# Patient Record
Sex: Female | Born: 1945 | Race: Black or African American | Hispanic: No | Smoking: Former smoker
Health system: Southern US, Community
[De-identification: ages and names within clinical notes are randomized; demographics above are authoritative.]

## PROBLEM LIST (undated history)

## (undated) DIAGNOSIS — IMO0001 Reserved for inherently not codable concepts without codable children: Secondary | ICD-10-CM

## (undated) DIAGNOSIS — Z9221 Personal history of antineoplastic chemotherapy: Secondary | ICD-10-CM

## (undated) DIAGNOSIS — C9210 Chronic myeloid leukemia, BCR/ABL-positive, not having achieved remission: Secondary | ICD-10-CM

## (undated) DIAGNOSIS — C569 Malignant neoplasm of unspecified ovary: Secondary | ICD-10-CM

## (undated) DIAGNOSIS — I10 Essential (primary) hypertension: Secondary | ICD-10-CM

## (undated) DIAGNOSIS — E119 Type 2 diabetes mellitus without complications: Secondary | ICD-10-CM

## (undated) DIAGNOSIS — C9590 Leukemia, unspecified not having achieved remission: Secondary | ICD-10-CM

## (undated) HISTORY — DX: Malignant neoplasm of unspecified ovary: C56.9

## (undated) HISTORY — PX: COLONOSCOPY: SHX174

## (undated) HISTORY — PX: OVARY SURGERY: SHX727

## (undated) HISTORY — DX: Chronic myeloid leukemia, BCR/ABL-positive, not having achieved remission: C92.10

## (undated) HISTORY — DX: Personal history of antineoplastic chemotherapy: Z92.21

---

## 2003-05-11 DIAGNOSIS — Z9221 Personal history of antineoplastic chemotherapy: Secondary | ICD-10-CM

## 2003-05-11 DIAGNOSIS — C569 Malignant neoplasm of unspecified ovary: Secondary | ICD-10-CM

## 2003-05-11 HISTORY — PX: ABDOMINAL HYSTERECTOMY: SHX81

## 2003-05-11 HISTORY — DX: Personal history of antineoplastic chemotherapy: Z92.21

## 2003-05-11 HISTORY — DX: Malignant neoplasm of unspecified ovary: C56.9

## 2003-12-31 ENCOUNTER — Other Ambulatory Visit: Payer: Self-pay

## 2004-02-19 ENCOUNTER — Ambulatory Visit: Payer: Self-pay | Admitting: Gynecologic Oncology

## 2004-03-10 ENCOUNTER — Ambulatory Visit: Payer: Self-pay | Admitting: Oncology

## 2004-04-09 ENCOUNTER — Ambulatory Visit: Payer: Self-pay | Admitting: Oncology

## 2004-05-10 ENCOUNTER — Ambulatory Visit: Payer: Self-pay | Admitting: Oncology

## 2004-06-10 ENCOUNTER — Ambulatory Visit: Payer: Self-pay | Admitting: Oncology

## 2004-06-26 ENCOUNTER — Ambulatory Visit: Payer: Self-pay | Admitting: Unknown Physician Specialty

## 2004-07-08 ENCOUNTER — Ambulatory Visit: Payer: Self-pay | Admitting: Oncology

## 2004-08-08 ENCOUNTER — Ambulatory Visit: Payer: Self-pay | Admitting: Oncology

## 2004-09-07 ENCOUNTER — Ambulatory Visit: Payer: Self-pay | Admitting: Oncology

## 2004-10-08 ENCOUNTER — Ambulatory Visit: Payer: Self-pay | Admitting: Oncology

## 2004-11-07 ENCOUNTER — Ambulatory Visit: Payer: Self-pay | Admitting: Oncology

## 2005-01-06 ENCOUNTER — Ambulatory Visit: Payer: Self-pay | Admitting: Oncology

## 2005-01-08 ENCOUNTER — Ambulatory Visit: Payer: Self-pay | Admitting: Oncology

## 2005-03-25 ENCOUNTER — Ambulatory Visit: Payer: Self-pay | Admitting: Oncology

## 2005-04-09 ENCOUNTER — Ambulatory Visit: Payer: Self-pay | Admitting: Oncology

## 2005-08-19 ENCOUNTER — Ambulatory Visit: Payer: Self-pay | Admitting: Oncology

## 2005-09-07 ENCOUNTER — Ambulatory Visit: Payer: Self-pay | Admitting: Oncology

## 2005-09-28 ENCOUNTER — Ambulatory Visit: Payer: Self-pay

## 2005-11-24 ENCOUNTER — Ambulatory Visit: Payer: Self-pay | Admitting: Oncology

## 2005-12-02 ENCOUNTER — Ambulatory Visit: Payer: Self-pay | Admitting: Oncology

## 2005-12-08 ENCOUNTER — Ambulatory Visit: Payer: Self-pay | Admitting: Oncology

## 2006-01-19 ENCOUNTER — Ambulatory Visit: Payer: Self-pay | Admitting: Gynecologic Oncology

## 2006-02-07 ENCOUNTER — Ambulatory Visit: Payer: Self-pay | Admitting: Gynecologic Oncology

## 2006-04-13 ENCOUNTER — Ambulatory Visit: Payer: Self-pay | Admitting: Gynecologic Oncology

## 2006-04-13 ENCOUNTER — Ambulatory Visit: Payer: Self-pay | Admitting: Oncology

## 2006-05-10 ENCOUNTER — Ambulatory Visit: Payer: Self-pay | Admitting: Oncology

## 2006-08-16 ENCOUNTER — Ambulatory Visit: Payer: Self-pay | Admitting: Oncology

## 2006-09-08 ENCOUNTER — Ambulatory Visit: Payer: Self-pay | Admitting: Oncology

## 2006-10-06 ENCOUNTER — Ambulatory Visit: Payer: Self-pay

## 2006-10-19 ENCOUNTER — Ambulatory Visit: Payer: Self-pay | Admitting: Oncology

## 2006-10-19 ENCOUNTER — Ambulatory Visit: Payer: Self-pay | Admitting: Gynecologic Oncology

## 2006-11-08 ENCOUNTER — Ambulatory Visit: Payer: Self-pay | Admitting: Oncology

## 2006-12-20 ENCOUNTER — Ambulatory Visit: Payer: Self-pay | Admitting: Oncology

## 2007-01-09 ENCOUNTER — Ambulatory Visit: Payer: Self-pay | Admitting: Oncology

## 2007-02-08 ENCOUNTER — Ambulatory Visit: Payer: Self-pay | Admitting: Oncology

## 2007-02-23 ENCOUNTER — Ambulatory Visit: Payer: Self-pay | Admitting: Oncology

## 2007-03-11 ENCOUNTER — Ambulatory Visit: Payer: Self-pay | Admitting: Oncology

## 2007-06-20 ENCOUNTER — Ambulatory Visit: Payer: Self-pay | Admitting: Oncology

## 2007-07-09 ENCOUNTER — Ambulatory Visit: Payer: Self-pay | Admitting: Oncology

## 2007-10-10 ENCOUNTER — Ambulatory Visit: Payer: Self-pay | Admitting: Internal Medicine

## 2008-01-02 ENCOUNTER — Ambulatory Visit: Payer: Self-pay | Admitting: Oncology

## 2008-01-03 ENCOUNTER — Ambulatory Visit: Payer: Self-pay | Admitting: Gynecologic Oncology

## 2008-01-09 ENCOUNTER — Ambulatory Visit: Payer: Self-pay | Admitting: Oncology

## 2008-02-08 ENCOUNTER — Ambulatory Visit: Payer: Self-pay | Admitting: Oncology

## 2008-07-08 ENCOUNTER — Ambulatory Visit: Payer: Self-pay | Admitting: Gynecologic Oncology

## 2008-08-06 ENCOUNTER — Ambulatory Visit: Payer: Self-pay | Admitting: Gynecologic Oncology

## 2008-08-08 ENCOUNTER — Ambulatory Visit: Payer: Self-pay | Admitting: Gynecologic Oncology

## 2008-09-07 ENCOUNTER — Ambulatory Visit: Payer: Self-pay | Admitting: Gynecologic Oncology

## 2008-10-11 ENCOUNTER — Ambulatory Visit: Payer: Self-pay | Admitting: Internal Medicine

## 2009-01-08 ENCOUNTER — Ambulatory Visit: Payer: Self-pay | Admitting: Gynecologic Oncology

## 2009-02-03 ENCOUNTER — Ambulatory Visit: Payer: Self-pay | Admitting: Gynecologic Oncology

## 2009-02-07 ENCOUNTER — Ambulatory Visit: Payer: Self-pay | Admitting: Gynecologic Oncology

## 2009-07-08 ENCOUNTER — Ambulatory Visit: Payer: Self-pay | Admitting: Oncology

## 2009-08-04 ENCOUNTER — Ambulatory Visit: Payer: Self-pay | Admitting: Oncology

## 2009-08-08 ENCOUNTER — Ambulatory Visit: Payer: Self-pay | Admitting: Oncology

## 2010-01-08 ENCOUNTER — Ambulatory Visit: Payer: Self-pay | Admitting: Gynecologic Oncology

## 2010-02-03 ENCOUNTER — Ambulatory Visit: Payer: Self-pay | Admitting: Oncology

## 2010-02-05 LAB — CA 125: CA 125: 11.2 U/mL (ref 0.0–34.0)

## 2010-02-07 ENCOUNTER — Ambulatory Visit: Payer: Self-pay | Admitting: Oncology

## 2010-08-11 ENCOUNTER — Ambulatory Visit: Payer: Self-pay | Admitting: Oncology

## 2010-09-08 ENCOUNTER — Ambulatory Visit: Payer: Self-pay | Admitting: Oncology

## 2011-02-09 ENCOUNTER — Ambulatory Visit: Payer: Self-pay | Admitting: Oncology

## 2011-02-10 LAB — CA 125: CA 125: 12.7 U/mL (ref 0.0–34.0)

## 2011-03-11 ENCOUNTER — Ambulatory Visit: Payer: Self-pay | Admitting: Oncology

## 2012-02-22 ENCOUNTER — Ambulatory Visit: Payer: Self-pay | Admitting: Oncology

## 2012-02-23 LAB — CA 125: CA 125: 13.8 U/mL (ref 0.0–34.0)

## 2012-03-10 ENCOUNTER — Ambulatory Visit: Payer: Self-pay | Admitting: Oncology

## 2014-11-25 ENCOUNTER — Inpatient Hospital Stay: Payer: Medicare Other

## 2014-11-25 ENCOUNTER — Inpatient Hospital Stay: Payer: Medicare Other | Attending: Oncology | Admitting: Oncology

## 2014-11-25 ENCOUNTER — Encounter: Payer: Self-pay | Admitting: Oncology

## 2014-11-25 VITALS — BP 135/85 | HR 98 | Temp 96.8°F | Wt 146.2 lb

## 2014-11-25 DIAGNOSIS — C569 Malignant neoplasm of unspecified ovary: Secondary | ICD-10-CM

## 2014-11-25 DIAGNOSIS — R634 Abnormal weight loss: Secondary | ICD-10-CM

## 2014-11-25 DIAGNOSIS — D649 Anemia, unspecified: Secondary | ICD-10-CM | POA: Insufficient documentation

## 2014-11-25 DIAGNOSIS — I10 Essential (primary) hypertension: Secondary | ICD-10-CM | POA: Diagnosis not present

## 2014-11-25 DIAGNOSIS — Z79899 Other long term (current) drug therapy: Secondary | ICD-10-CM | POA: Diagnosis not present

## 2014-11-25 DIAGNOSIS — E119 Type 2 diabetes mellitus without complications: Secondary | ICD-10-CM | POA: Diagnosis not present

## 2014-11-25 DIAGNOSIS — D509 Iron deficiency anemia, unspecified: Secondary | ICD-10-CM | POA: Insufficient documentation

## 2014-11-25 DIAGNOSIS — Z8543 Personal history of malignant neoplasm of ovary: Secondary | ICD-10-CM | POA: Diagnosis not present

## 2014-11-25 DIAGNOSIS — Z8041 Family history of malignant neoplasm of ovary: Secondary | ICD-10-CM | POA: Insufficient documentation

## 2014-11-25 DIAGNOSIS — Z7982 Long term (current) use of aspirin: Secondary | ICD-10-CM | POA: Diagnosis not present

## 2014-11-25 DIAGNOSIS — D72829 Elevated white blood cell count, unspecified: Secondary | ICD-10-CM | POA: Diagnosis not present

## 2014-11-25 LAB — CBC WITH DIFFERENTIAL/PLATELET
BASOS ABS: 3.7 10*3/uL — AB (ref 0.0–0.1)
BLASTS: 0 %
Band Neutrophils: 9 % (ref 0–10)
Basophils Relative: 4 % — ABNORMAL HIGH (ref 0–1)
Eosinophils Absolute: 8.2 10*3/uL — ABNORMAL HIGH (ref 0.0–0.7)
Eosinophils Relative: 9 % — ABNORMAL HIGH (ref 0–5)
HCT: 28.6 % — ABNORMAL LOW (ref 35.0–47.0)
Hemoglobin: 8.9 g/dL — ABNORMAL LOW (ref 12.0–16.0)
Lymphocytes Relative: 9 % — ABNORMAL LOW (ref 12–46)
Lymphs Abs: 8.2 10*3/uL — ABNORMAL HIGH (ref 0.7–4.0)
MCH: 29 pg (ref 26.0–34.0)
MCHC: 31 g/dL — AB (ref 32.0–36.0)
MCV: 93.8 fL (ref 80.0–100.0)
METAMYELOCYTES PCT: 3 %
MYELOCYTES: 8 %
Monocytes Absolute: 5.5 10*3/uL — ABNORMAL HIGH (ref 0.1–1.0)
Monocytes Relative: 6 % (ref 3–12)
NEUTROS ABS: 61.2 10*3/uL — AB (ref 1.7–7.7)
Neutrophils Relative %: 47 % (ref 43–77)
Other: 5 %
Platelets: 123 10*3/uL — ABNORMAL LOW (ref 150–440)
Promyelocytes Absolute: 0 %
RBC: 3.05 MIL/uL — AB (ref 3.80–5.20)
RDW: 21.5 % — ABNORMAL HIGH (ref 11.5–14.5)
WBC: 91.3 10*3/uL (ref 3.6–11.0)
nRBC: 9 /100 WBC — ABNORMAL HIGH

## 2014-11-25 LAB — IRON AND TIBC
Iron: 14 ug/dL — ABNORMAL LOW (ref 28–170)
SATURATION RATIOS: 8 % — AB (ref 10.4–31.8)
TIBC: 181 ug/dL — ABNORMAL LOW (ref 250–450)
UIBC: 167 ug/dL

## 2014-11-25 LAB — FERRITIN: Ferritin: 318 ng/mL — ABNORMAL HIGH (ref 11–307)

## 2014-11-26 ENCOUNTER — Encounter: Payer: Self-pay | Admitting: Oncology

## 2014-11-26 ENCOUNTER — Other Ambulatory Visit: Payer: Self-pay | Admitting: *Deleted

## 2014-11-26 DIAGNOSIS — C569 Malignant neoplasm of unspecified ovary: Secondary | ICD-10-CM

## 2014-11-26 LAB — CA 125: CA 125: 13.4 U/mL (ref 0.0–38.1)

## 2014-11-26 NOTE — Progress Notes (Signed)
[place] @ [facility] [phone]  Fax:(336) [place] OB: 04/27/1946  MR#: [id number]  CXK#:[id number]  Patient Care Team: No Pcp Per Patient as PCP - General (General Practice)  CHIEF COMPLAINT:  Chief Complaint  Patient presents with  . New Evaluation    VISIT DIAGNOSIS:     ICD-9-CM ICD-10-CM   1. Ovarian cancer, unspecified laterality 183.0 C56.9 atenolol (TENORMIN) 50 MG tablet     lisinopril-hydrochlorothiazide (PRINZIDE,ZESTORETIC) 20-12.5 MG per tablet     glipiZIDE (GLUCOTROL) 10 MG tablet     metFORMIN (GLUCOPHAGE) 1000 MG tablet     pravastatin (PRAVACHOL) 40 MG tablet     aspirin 81 MG tablet     CBC with Differential     CA 125     Ferritin     Iron and TIBC     CT Abdomen Pelvis W Contrast     DG Chest 2 View  2. Loss of weight 783.21 R63.4 atenolol (TENORMIN) 50 MG tablet     lisinopril-hydrochlorothiazide (PRINZIDE,ZESTORETIC) 20-12.5 MG per tablet     glipiZIDE (GLUCOTROL) 10 MG tablet     metFORMIN (GLUCOPHAGE) 1000 MG tablet     pravastatin (PRAVACHOL) 40 MG tablet     aspirin 81 MG tablet     CBC with Differential     CA 125     Ferritin     Iron and TIBC     CT Abdomen Pelvis W Contrast     DG Chest 2 View     Oncology History     Chief Complaint/Diagnosis   02/2004    Adenocarcinoma ovary stage IIIc                 TRS                 Carboplatin and Abraxane x 6, completed NED 07/2004 2.  Patient presented with leukocytosis, anemia and thrombocytopenia     Ovarian cancer    69 year old lady presented with leukocytosis, anemia, and significant weight loss of approximately 20 pounds in last few months. Asian had a previous history of ovarian cancer diagnosed in 2005 received chemotherapy with carboplatinum and Abraxane 6 INTERVAL HISTORY:  69 year old lady was referred back to me because of leukocytosis and anemia and thrombocytopenia.  According to patient has lost 16 pounds of weight.   Patient feels hungry all the time.  No abdominal discomfort.  Appetite has been stable.  No nausea.  No vomiting.  Has noticed mild abdominal distention. Getting regular mammogram done  REVIEW OF SYSTEMS:   GENERAL: Feels weak losing weight and feels hungry all the time PERFORMANCE STATUS (ECOG): 01 HEENT:  No visual changes, runny nose, sore throat, mouth sores or tenderness. Lungs: No shortness of breath or cough.  No hemoptysis. Cardiac:  No chest pain, palpitations, orthopnea, or PND. GI:  As per history and physical. GU:  No urgency, frequency, dysuria, or hematuria. Musculoskeletal:  No back pain.  No joint pain.  No muscle tenderness. Extremities:  No pain or swelling. Skin:  No rashes or skin changes. Neuro:  No headache, numbness or weakness, balance or coordination issues. Endocrine:  No diabetes, thyroid issues, hot flashes or night sweats. Psych:  No mood changes, depression or anxiety. Pain:  No focal pain. Review of systems:  All other systems reviewed and found to be negative. As per HPI. Otherwise, a complete review of systems is negatve.  PAST MEDICAL HISTORY: Past Medical History  Diagnosis Date  . Ovarian cancer 11/26/2014   I hypertension.  Diabetes.  PAST SURGICAL HISTORY: Surgical history is positive for ovarian cancer FAMILY HISTORY Patient had 3 sisters one died of breast cancer.  One died of heart failure or.    Has 2 boys 61 and 69 years old no significant illness      ADVANCED DIRECTIVES:  Patient does not have any living will or healthcare power of attorney.  Information was given .  Available resources had been discussed.  We will follow-up on subsequent appointments regarding this issue HEALTH MAINTENANCE: History  Substance Use Topics  . Smoking status: Former Research scientist (life sciences)  . Smokeless tobacco: Not on file  . Alcohol Use: Not on file    Patient did not have any colonoscopy done Mammogram  Has  been scheduled next month No Known  Allergies  Current Outpatient Prescriptions  Medication Sig Dispense Refill  . aspirin 81 MG tablet Take 81 mg by mouth daily.    Marland Kitchen atenolol (TENORMIN) 50 MG tablet     . glipiZIDE (GLUCOTROL) 10 MG tablet     . lisinopril-hydrochlorothiazide (PRINZIDE,ZESTORETIC) 20-12.5 MG per tablet     . metFORMIN (GLUCOPHAGE) 1000 MG tablet     . pravastatin (PRAVACHOL) 40 MG tablet Take 40 mg by mouth daily.     No current facility-administered medications for this visit.    OBJECTIVE: PHYSICAL EXAM: GENERAL:  Well developed, well nourished, sitting comfortably in the exam room in no acute distress. MENTAL STATUS:  Alert and oriented to person, place and time. HEAD:    Normocephalic, atraumatic, face symmetric, no Cushingoid features. EYE  Pupils equal round and reactive to light and accomodation.  No conjunctivitis or scleral icteruses.. ENT:  Oropharynx clear without lesion.  Tongue normal. Mucous membranes moist.  Small palpable lymph node in the left cervical area less than a centimeter RESPIRATORY:  Clear to auscultation without rales, wheezes or rhonchi. CARDIOVASCULAR:  Regular rate and rhythm without murmur, rub or gallop. BREAST:  Right breast without masses, skin changes or nipple discharge.  Left breast without masses, skin changes or nipple discharge. ABDOMEN:  Mild distention.  No ascites.  Bowel sounds are present.  Liver and spleen not palpable BACK:  No CVA tenderness.  No tenderness on percussion of the back or rib cage. SKIN:  No rashes, ulcers or lesions. EXTREMITIES: No edema, no skin discoloration or tenderness.  No palpable cords. LYMPH NODES: Palpable supraclavicular lymph node supraclavicular, axillary or inguinal adenopathy  NEUROLOGICAL: Unremarkable. PSYCH:  Appropriate.  Filed Vitals:   11/25/14 0839  BP: 135/85  Pulse: 98  Temp: 96.8 F (36 C)     There is no height on file to calculate BMI.    ECOG FS:1 - Symptomatic but completely ambulatory  LAB  RESULTS:  Appointment on 11/25/2014  Component Date Value Ref Range Status  . WBC 11/25/2014 91.3* 3.6 - 11.0 K/uL Final   CANCER CENTER CRITICAL VALUE PROTOCOL  . RBC 11/25/2014 3.05* 3.80 - 5.20 MIL/uL Final   8.9  . Hemoglobin 11/25/2014 8.9* 12.0 - 16.0 g/dL Final  . HCT 11/25/2014 28.6* 35.0 - 47.0 % Final  . MCV 11/25/2014 93.8  80.0 - 100.0 fL Final  . MCH 11/25/2014 29.0  26.0 - 34.0 pg Final  . MCHC 11/25/2014 31.0* 32.0 - 36.0 g/dL Final  . RDW 11/25/2014 21.5* 11.5 - 14.5 % Final  . Platelets 11/25/2014 123* 150 - 440 K/uL Final  . Neutrophils Relative % 11/25/2014 47  43 - 77 % Final  . Lymphocytes Relative 11/25/2014 9* 12 - 46 % Final  . Monocytes Relative 11/25/2014 6  3 - 12 % Final  . Eosinophils Relative 11/25/2014 9* 0 - 5 % Final  . Basophils Relative 11/25/2014 4* 0 - 1 % Final  . Band Neutrophils 11/25/2014 9  0 - 10 % Final  . Metamyelocytes Relative 11/25/2014 3   Final  . Myelocytes 11/25/2014 8   Final  . Promyelocytes Absolute 11/25/2014 0   Final  . Blasts 11/25/2014 0   Final  . nRBC 11/25/2014 9* 0 /100 WBC Final  . Other 11/25/2014 5   Final  . Neutro Abs 11/25/2014 61.2* 1.7 - 7.7 K/uL Final  . Lymphs Abs 11/25/2014 8.2* 0.7 - 4.0 K/uL Final  . Monocytes Absolute 11/25/2014 5.5* 0.1 - 1.0 K/uL Final  . Eosinophils Absolute 11/25/2014 8.2* 0.0 - 0.7 K/uL Final  . Basophils Absolute 11/25/2014 3.7* 0.0 - 0.1 K/uL Final  . RBC Morphology 11/25/2014 MARKED POIKILOCYTOSIS   Final   Comment: ELLIPTOCYTES POLYCHROMASIA PRESENT   . Smear Review 11/25/2014 SMUDGE CELLS   Final   Comment: VACUOLATED NEUTROPHILS PLATELETS APPEAR DECREASED PENDING PATHOLOGIST REVIEW   . CA 125 11/25/2014 13.4  0.0 - 38.1 U/mL Final   Comment: (NOTE) Roche ECLIA methodology Performed At: [facility] [place], [place] [id number] Lindon Romp MD FY:[id number]   . Ferritin 11/25/2014 318* 11 - 307 ng/mL Final  . Iron 11/25/2014 14* 28 - 170  ug/dL Final  . TIBC 11/25/2014 181* 250 - 450 ug/dL Final  . Saturation Ratios 11/25/2014 8* 10.4 - 31.8 % Final  . UIBC 11/25/2014 167   Final     ASSESSMENT:  Leukocytosis with anemia Possibility of leukoerythroblastic reaction, reactive leukocytosis versus myeloproliferative disease like chronic myelogenous leukemia Iron deficiency anemia History of ovarian cancer normal CEA 125  PLAN:  CT scan of abdomen and pelvis and chest x-ray ABL BCR for chronic myelogenous leukemia Will also proceed with   My risk genetic study because of patient's sister   With history of breast cancer as well as ovarian cancer Reevaluate patient after all this information is available  Patient expressed understanding and was in agreement with this plan. She also understands that She can call clinic at any time with any questions, concerns, or complaints.    No matching staging information was found for the patient.  Forest Gleason, MD   11/26/2014 8:35 AM

## 2014-11-27 ENCOUNTER — Ambulatory Visit
Admission: RE | Admit: 2014-11-27 | Discharge: 2014-11-27 | Disposition: A | Discharge status: Home / Self Care | Payer: Medicare Other | Source: Ambulatory Visit | Attending: Oncology | Admitting: Oncology

## 2014-11-27 ENCOUNTER — Inpatient Hospital Stay: Payer: Medicare Other

## 2014-11-27 DIAGNOSIS — Z90710 Acquired absence of both cervix and uterus: Secondary | ICD-10-CM | POA: Insufficient documentation

## 2014-11-27 DIAGNOSIS — C569 Malignant neoplasm of unspecified ovary: Secondary | ICD-10-CM | POA: Insufficient documentation

## 2014-11-27 DIAGNOSIS — R161 Splenomegaly, not elsewhere classified: Secondary | ICD-10-CM | POA: Diagnosis not present

## 2014-11-27 DIAGNOSIS — R590 Localized enlarged lymph nodes: Secondary | ICD-10-CM | POA: Diagnosis not present

## 2014-11-27 DIAGNOSIS — R634 Abnormal weight loss: Secondary | ICD-10-CM

## 2014-11-27 HISTORY — DX: Type 2 diabetes mellitus without complications: E11.9

## 2014-11-27 HISTORY — DX: Essential (primary) hypertension: I10

## 2014-11-27 MED ORDER — IOHEXOL 300 MG/ML  SOLN
100.0000 mL | Freq: Once | INTRAMUSCULAR | Status: AC | PRN
  Administered 2014-11-27: 100 mL via INTRAVENOUS

## 2014-11-29 ENCOUNTER — Telehealth: Payer: Self-pay | Admitting: *Deleted

## 2014-11-29 DIAGNOSIS — R591 Generalized enlarged lymph nodes: Secondary | ICD-10-CM

## 2014-11-29 DIAGNOSIS — D699 Hemorrhagic condition, unspecified: Secondary | ICD-10-CM

## 2014-11-29 DIAGNOSIS — D72829 Elevated white blood cell count, unspecified: Secondary | ICD-10-CM

## 2014-11-29 DIAGNOSIS — Z7982 Long term (current) use of aspirin: Secondary | ICD-10-CM

## 2014-11-29 DIAGNOSIS — Z9189 Other specified personal risk factors, not elsewhere classified: Secondary | ICD-10-CM

## 2014-11-29 NOTE — Addendum Note (Signed)
Addended by: Telford Nab on: 11/29/2014 11:37 AM   Modules accepted: Orders

## 2014-11-29 NOTE — Addendum Note (Signed)
Addended by: Telford Nab on: 11/29/2014 04:27 PM   Modules accepted: Orders

## 2014-11-29 NOTE — Telephone Encounter (Signed)
Attempted to call pt to schedule for CT guided bone marrow biopsy due to elevated WBC and enlarged lymph nodes suggestive of lymphoma. Pt did not answer after multiple attempts. Left voicemail for pt to call back.

## 2014-11-29 NOTE — Telephone Encounter (Signed)
Pt called back and pt was informed that CT guided bone marrow biopsy will be scheduled along with followup with MD 7 days after biopsy. Pt verbalized understanding. Stated that we can call her cell phone after 1:30pm. Cell number [phone].

## 2014-12-02 LAB — PATHOLOGIST SMEAR REVIEW

## 2014-12-04 ENCOUNTER — Inpatient Hospital Stay: Payer: Medicare Other | Admitting: Oncology

## 2014-12-05 ENCOUNTER — Other Ambulatory Visit: Payer: Self-pay | Admitting: Radiology

## 2014-12-05 ENCOUNTER — Inpatient Hospital Stay: Payer: Medicare Other

## 2014-12-05 DIAGNOSIS — D72829 Elevated white blood cell count, unspecified: Secondary | ICD-10-CM | POA: Diagnosis not present

## 2014-12-05 DIAGNOSIS — C569 Malignant neoplasm of unspecified ovary: Secondary | ICD-10-CM

## 2014-12-05 DIAGNOSIS — D699 Hemorrhagic condition, unspecified: Secondary | ICD-10-CM

## 2014-12-05 DIAGNOSIS — R591 Generalized enlarged lymph nodes: Secondary | ICD-10-CM

## 2014-12-05 LAB — PROTIME-INR
INR: 1.38
PROTHROMBIN TIME: 17.2 s — AB (ref 11.4–15.0)

## 2014-12-05 LAB — CBC WITH DIFFERENTIAL/PLATELET
BASOS PCT: 17 %
BLASTS: 1 %
Band Neutrophils: 4 %
Basophils Absolute: 15.7 10*3/uL — ABNORMAL HIGH (ref 0–0.1)
EOS ABS: 23 10*3/uL — AB (ref 0–0.7)
EOS PCT: 25 %
HCT: 28.5 % — ABNORMAL LOW (ref 35.0–47.0)
HEMOGLOBIN: 9.1 g/dL — AB (ref 12.0–16.0)
Lymphocytes Relative: 9 %
Lymphs Abs: 8.3 10*3/uL — ABNORMAL HIGH (ref 1.0–3.6)
MCH: 29.5 pg (ref 26.0–34.0)
MCHC: 31.9 g/dL — ABNORMAL LOW (ref 32.0–36.0)
MCV: 92.6 fL (ref 80.0–100.0)
MONOS PCT: 5 %
MYELOCYTES: 6 %
Metamyelocytes Relative: 3 %
Monocytes Absolute: 4.6 10*3/uL — ABNORMAL HIGH (ref 0.2–0.9)
NEUTROS ABS: 38.7 10*3/uL — AB (ref 1.4–6.5)
NEUTROS PCT: 29 %
NRBC: 7 /100{WBCs} — AB
Other: 1 %
PLATELETS: 141 10*3/uL — AB (ref 150–440)
RBC: 3.07 MIL/uL — ABNORMAL LOW (ref 3.80–5.20)
RDW: 22.1 % — ABNORMAL HIGH (ref 11.5–14.5)
Smear Review: DECREASED
WBC: 92.1 10*3/uL (ref 3.6–11.0)

## 2014-12-05 LAB — APTT: aPTT: 39 seconds — ABNORMAL HIGH (ref 24–36)

## 2014-12-06 ENCOUNTER — Ambulatory Visit
Admission: RE | Admit: 2014-12-06 | Discharge: 2014-12-06 | Disposition: A | Discharge status: Home / Self Care | Payer: Medicare Other | Source: Ambulatory Visit | Attending: Oncology | Admitting: Oncology

## 2014-12-06 DIAGNOSIS — R591 Generalized enlarged lymph nodes: Secondary | ICD-10-CM

## 2014-12-06 DIAGNOSIS — D7589 Other specified diseases of blood and blood-forming organs: Secondary | ICD-10-CM | POA: Diagnosis not present

## 2014-12-06 DIAGNOSIS — I10 Essential (primary) hypertension: Secondary | ICD-10-CM | POA: Diagnosis not present

## 2014-12-06 DIAGNOSIS — D72829 Elevated white blood cell count, unspecified: Secondary | ICD-10-CM | POA: Diagnosis present

## 2014-12-06 DIAGNOSIS — Z8543 Personal history of malignant neoplasm of ovary: Secondary | ICD-10-CM | POA: Insufficient documentation

## 2014-12-06 DIAGNOSIS — D72828 Other elevated white blood cell count: Secondary | ICD-10-CM | POA: Insufficient documentation

## 2014-12-06 DIAGNOSIS — D6489 Other specified anemias: Secondary | ICD-10-CM | POA: Diagnosis not present

## 2014-12-06 DIAGNOSIS — Z90722 Acquired absence of ovaries, bilateral: Secondary | ICD-10-CM | POA: Insufficient documentation

## 2014-12-06 DIAGNOSIS — E119 Type 2 diabetes mellitus without complications: Secondary | ICD-10-CM | POA: Diagnosis not present

## 2014-12-06 DIAGNOSIS — Z90710 Acquired absence of both cervix and uterus: Secondary | ICD-10-CM | POA: Insufficient documentation

## 2014-12-06 HISTORY — DX: Reserved for inherently not codable concepts without codable children: IMO0001

## 2014-12-06 LAB — DIFFERENTIAL
BAND NEUTROPHILS: 4 % (ref 0–10)
Basophils Relative: 6 % — ABNORMAL HIGH (ref 0–1)
Blasts: 0 %
EOS PCT: 11 % — AB (ref 0–5)
LYMPHS PCT: 5 % — AB (ref 12–46)
MONOS PCT: 2 % — AB (ref 3–12)
MYELOCYTES: 2 %
Metamyelocytes Relative: 1 %
Neutrophils Relative %: 46 % (ref 43–77)
Other: 5 %
Promyelocytes Absolute: 18 %
nRBC: 4 /100 WBC — ABNORMAL HIGH

## 2014-12-06 LAB — CBC
HCT: 27 % — ABNORMAL LOW (ref 35.0–47.0)
HEMOGLOBIN: 8.8 g/dL — AB (ref 12.0–16.0)
MCH: 29.7 pg (ref 26.0–34.0)
MCHC: 32.5 g/dL (ref 32.0–36.0)
MCV: 91.5 fL (ref 80.0–100.0)
Platelets: 142 10*3/uL — ABNORMAL LOW (ref 150–440)
RBC: 2.95 MIL/uL — ABNORMAL LOW (ref 3.80–5.20)
RDW: 22.7 % — ABNORMAL HIGH (ref 11.5–14.5)
WBC: 95.4 10*3/uL — AB (ref 4.0–10.5)

## 2014-12-06 LAB — PROTIME-INR
INR: 1.43
Prothrombin Time: 17.6 seconds — ABNORMAL HIGH (ref 11.4–15.0)

## 2014-12-06 MED ORDER — FENTANYL CITRATE (PF) 100 MCG/2ML IJ SOLN
INTRAMUSCULAR | Status: AC | PRN
  Administered 2014-12-06: 50 ug via INTRAVENOUS
  Administered 2014-12-06: 25 ug via INTRAVENOUS

## 2014-12-06 MED ORDER — SODIUM CHLORIDE 0.9 % IV SOLN
INTRAVENOUS | Status: DC
  Administered 2014-12-06: 09:00:00 via INTRAVENOUS

## 2014-12-06 MED ORDER — MIDAZOLAM HCL 5 MG/5ML IJ SOLN
INTRAMUSCULAR | Status: AC | PRN
  Administered 2014-12-06 (×2): 1 mg via INTRAVENOUS

## 2014-12-06 NOTE — Progress Notes (Signed)
fsbs 174

## 2014-12-06 NOTE — Sedation Documentation (Signed)
ETCO2 monitor not working.  Unable to monitor.

## 2014-12-06 NOTE — Procedures (Signed)
R iliac BM Bx No comp/EBL 

## 2014-12-09 LAB — BCR-ABL1, CML/ALL, PCR, QUANT
E1A2 Transcript: 0.022 %
b2a2 transcript: 1.088 %
b3a2 transcript: 47.95 %

## 2014-12-11 ENCOUNTER — Ambulatory Visit: Payer: Medicare Other | Admitting: Oncology

## 2014-12-11 LAB — BCR-ABL1 FISH
CELLS COUNTED: 100
Cells Analyzed: 100

## 2014-12-11 LAB — GLUCOSE, CAPILLARY: Glucose-Capillary: 174 mg/dL — ABNORMAL HIGH (ref 65–99)

## 2014-12-12 ENCOUNTER — Inpatient Hospital Stay: Payer: Medicare Other

## 2014-12-12 ENCOUNTER — Other Ambulatory Visit: Payer: Self-pay | Admitting: *Deleted

## 2014-12-12 ENCOUNTER — Inpatient Hospital Stay: Payer: Medicare Other | Attending: Oncology | Admitting: Oncology

## 2014-12-12 VITALS — BP 167/82 | HR 105 | Temp 97.3°F | Resp 18 | Wt 146.5 lb

## 2014-12-12 DIAGNOSIS — E119 Type 2 diabetes mellitus without complications: Secondary | ICD-10-CM | POA: Diagnosis not present

## 2014-12-12 DIAGNOSIS — Z87891 Personal history of nicotine dependence: Secondary | ICD-10-CM | POA: Insufficient documentation

## 2014-12-12 DIAGNOSIS — R590 Localized enlarged lymph nodes: Secondary | ICD-10-CM | POA: Diagnosis not present

## 2014-12-12 DIAGNOSIS — D649 Anemia, unspecified: Secondary | ICD-10-CM | POA: Diagnosis not present

## 2014-12-12 DIAGNOSIS — C569 Malignant neoplasm of unspecified ovary: Secondary | ICD-10-CM

## 2014-12-12 DIAGNOSIS — Z79899 Other long term (current) drug therapy: Secondary | ICD-10-CM | POA: Diagnosis not present

## 2014-12-12 DIAGNOSIS — C9210 Chronic myeloid leukemia, BCR/ABL-positive, not having achieved remission: Secondary | ICD-10-CM | POA: Diagnosis not present

## 2014-12-12 DIAGNOSIS — Z7982 Long term (current) use of aspirin: Secondary | ICD-10-CM | POA: Insufficient documentation

## 2014-12-12 DIAGNOSIS — D72829 Elevated white blood cell count, unspecified: Secondary | ICD-10-CM

## 2014-12-12 DIAGNOSIS — Z8543 Personal history of malignant neoplasm of ovary: Secondary | ICD-10-CM | POA: Insufficient documentation

## 2014-12-12 DIAGNOSIS — R161 Splenomegaly, not elsewhere classified: Secondary | ICD-10-CM | POA: Insufficient documentation

## 2014-12-12 DIAGNOSIS — D509 Iron deficiency anemia, unspecified: Secondary | ICD-10-CM | POA: Insufficient documentation

## 2014-12-12 DIAGNOSIS — I10 Essential (primary) hypertension: Secondary | ICD-10-CM | POA: Insufficient documentation

## 2014-12-12 LAB — CBC WITH DIFFERENTIAL/PLATELET
Band Neutrophils: 3 %
Basophils Absolute: 25.1 10*3/uL — ABNORMAL HIGH (ref 0–0.1)
Basophils Relative: 19 %
Blasts: 4 %
Eosinophils Absolute: 35.7 10*3/uL — ABNORMAL HIGH (ref 0–0.7)
Eosinophils Relative: 27 %
HCT: 27.6 % — ABNORMAL LOW (ref 35.0–47.0)
Hemoglobin: 9.1 g/dL — ABNORMAL LOW (ref 12.0–16.0)
Lymphocytes Relative: 7 %
Lymphs Abs: 9.3 10*3/uL — ABNORMAL HIGH (ref 1.0–3.6)
MCH: 30.4 pg (ref 26.0–34.0)
MCHC: 33 g/dL (ref 32.0–36.0)
MCV: 92.1 fL (ref 80.0–100.0)
METAMYELOCYTES PCT: 3 %
MONO ABS: 5.3 10*3/uL — AB (ref 0.2–0.9)
MYELOCYTES: 12 %
Monocytes Relative: 4 %
NEUTROS PCT: 17 %
Neutro Abs: 51.6 10*3/uL — ABNORMAL HIGH (ref 1.4–6.5)
Platelets: 200 10*3/uL (ref 150–440)
Promyelocytes Absolute: 4 %
RBC: 2.99 MIL/uL — AB (ref 3.80–5.20)
RDW: 22.2 % — ABNORMAL HIGH (ref 11.5–14.5)
WBC: 132.3 10*3/uL (ref 3.6–11.0)
nRBC: 3 /100 WBC — ABNORMAL HIGH

## 2014-12-12 MED ORDER — IMATINIB MESYLATE 400 MG PO TABS: 400.0000 mg | ORAL_TABLET | Freq: Every day | ORAL | Status: DC

## 2014-12-12 NOTE — Progress Notes (Signed)
Patient does not have living will.  Former smoker. 

## 2014-12-13 ENCOUNTER — Encounter: Payer: Self-pay | Admitting: Oncology

## 2014-12-13 ENCOUNTER — Telehealth: Payer: Self-pay | Admitting: *Deleted

## 2014-12-13 NOTE — Progress Notes (Signed)
[place] @ [facility] Telephone:(336) [phone]  Fax:(336) [phone]   INITIAL CONSULT  Mikayla Keller OB: 11-24-1945  MR#: [id number]  ALP#:[id number]  Patient Care Team: Elton Sin, DO as PCP - General (Internal Medicine)  CHIEF COMPLAINT:  Chief Complaint  Patient presents with  . Follow-up    VISIT DIAGNOSIS:     ICD-9-CM ICD-10-CM   1. 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  Patient presented with leukocytosis, anemia and thrombocytopenia     Ovarian cancer    69 year old lady presented with leukocytosis, anemia, and significant weight loss of approximately 20 pounds in last few months. Asian had a previous history of ovarian cancer diagnosed in 2005 received chemotherapy with carboplatinum and Abraxane 6 INTERVAL HISTORY:  69 year old lady was referred back to me because of leukocytosis and anemia and thrombocytopenia.  According to patient has lost 16 pounds of weight.  Patient feels hungry all the time.  No abdominal discomfort.  Appetite has been stable.  No nausea.  No vomiting.  Has noticed mild abdominal distention. Getting regular mammogram done December 12, 2014 Patient is here for further follow-up.  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  I will discuss this situation with pathologist.  I discussed situation pathologist locally.  Hemoglobin is improved.  Patient is afebrile.  Has a palpable lymph node which is increase in size in the neck.  REVIEW OF SYSTEMS:   GENERAL: Feels weak losing weight and feels hungry all the time PERFORMANCE STATUS (ECOG):  01 HEENT:  No visual changes, runny nose, sore throat, mouth sores or tenderness. Lungs: No shortness of breath or cough.  No hemoptysis. Cardiac:  No chest pain, palpitations, orthopnea, or PND. GI:  As per history and physical. GU:  No urgency, frequency, dysuria, or hematuria. Musculoskeletal:  No back pain.  No joint pain.  No muscle tenderness. Extremities:  No pain or swelling. Skin:  No rashes or skin changes. Neuro:  No headache, numbness or weakness, balance or coordination issues. Endocrine:  No diabetes, thyroid issues, hot flashes or night sweats. Psych:  No mood changes, depression or anxiety. Pain:  No focal pain. Review of systems:  All other systems reviewed and found to be negative. As per HPI. Otherwise, a complete review of systems is negatve.  PAST MEDICAL HISTORY: Past Medical History  Diagnosis Date  . Ovarian cancer 11/26/2014    1st diag 4 yrs ago per pt  . Hypertension   . Diabetes mellitus without complication   . Shortness of breath dyspnea    I hypertension.  Diabetes.  PAST SURGICAL HISTORY: Surgical history is positive for ovarian cancer FAMILY HISTORY Patient had 3 sisters one died of breast cancer.  One died of heart failure or.    Has 2 boys 6 and 69 years old no significant illness      ADVANCED DIRECTIVES:  Patient does not have any living will or healthcare power of attorney.  Information was given .  Available resources had been discussed.  We will follow-up on subsequent appointments regarding this issue HEALTH MAINTENANCE: History  Substance Use Topics  . Smoking status: Former Smoker    Quit date: 12/06/1958  . Smokeless tobacco: Not on file  . Alcohol Use: No    Patient did not have any colonoscopy done Mammogram  Has  been scheduled next month No Known Allergies  Current Outpatient Prescriptions  Medication Sig Dispense Refill  . aspirin 81 MG tablet Take 81 mg by mouth daily.    Marland Kitchen atenolol (TENORMIN) 50 MG tablet     .  glipiZIDE (GLUCOTROL) 10 MG tablet     . lisinopril-hydrochlorothiazide (PRINZIDE,ZESTORETIC) 20-12.5 MG per tablet     . metFORMIN (GLUCOPHAGE) 1000 MG tablet     . pravastatin (PRAVACHOL) 40 MG tablet Take 40 mg by mouth daily.    Marland Kitchen imatinib (GLEEVEC) 400 MG tablet Take 1 tablet (400 mg total) by mouth daily. Take with meals and large glass of water.Caution:Chemotherapy. 30 tablet 5   No current facility-administered medications for this visit.    OBJECTIVE: PHYSICAL EXAM: GENERAL:  Well developed, well nourished, sitting comfortably in the exam room in no acute distress. MENTAL STATUS:  Alert and oriented to person, place and time. HEAD:    Normocephalic, atraumatic, face symmetric, no Cushingoid features. EYE  Pupils equal round and reactive to light and accomodation.  No conjunctivitis or scleral icteruses.. ENT:  Oropharynx clear without lesion.  Tongue normal. Mucous membranes moist.. Palpable left submandibular lymph node versus gland  Small palpable lymph node in the left cervical area less than a centimeter RESPIRATORY:  Clear to auscultation without rales, wheezes or rhonchi. CARDIOVASCULAR:  Regular rate and rhythm without murmur, rub or gallop. BREAST:  Right breast without masses, skin changes or nipple discharge.  Left breast without masses, skin changes or nipple discharge. ABDOMEN:  Mild distention.  No ascites.  Bowel sounds are present.  Liver and spleen not palpable BACK:  No CVA tenderness.  No tenderness on percussion of the back or rib cage. SKIN:  No rashes, ulcers or lesions. EXTREMITIES: No edema, no skin discoloration or tenderness.  No palpable cords. LYMPH NODES: Palpable supraclavicular lymph node supraclavicular, axillary or inguinal adenopathy,PALPABLE left cervical lymph node has increased in size NEUROLOGICAL: Unremarkable. PSYCH:  Appropriate.  Filed Vitals:   12/12/14 1458  BP: 167/82  Pulse: 105  Temp: 97.3 F (36.3 C)  Resp: 18     Body mass  index is 23.66 kg/(m^2).    ECOG FS:1 - Symptomatic but completely ambulatory  LAB RESULTS:  Appointment on 12/12/2014  Component Date Value Ref Range Status  . WBC 12/12/2014 132.3* 3.6 - 11.0 K/uL Final   Comment: RESULT REPEATED AND VERIFIED CANCER CENTER CRITICAL VALUE PROTOCOL   . RBC 12/12/2014 2.99* 3.80 - 5.20 MIL/uL Final  . Hemoglobin 12/12/2014 9.1* 12.0 - 16.0 g/dL Final  . HCT 12/12/2014 27.6* 35.0 - 47.0 % Final  . MCV 12/12/2014 92.1  80.0 - 100.0 fL Final  . MCH 12/12/2014 30.4  26.0 - 34.0 pg Final  . MCHC 12/12/2014 33.0  32.0 - 36.0 g/dL Final  . RDW 12/12/2014 22.2* 11.5 - 14.5 % Final  . Platelets 12/12/2014 200  150 - 440 K/uL Final  . Neutrophils Relative % 12/12/2014 17   Final  . Lymphocytes Relative 12/12/2014 7   Final  . Monocytes Relative 12/12/2014 4   Final  . Eosinophils Relative 12/12/2014 27   Final  . Basophils  Relative 12/12/2014 19   Final  . Band Neutrophils 12/12/2014 3   Final  . Metamyelocytes Relative 12/12/2014 3   Final  . Myelocytes 12/12/2014 12   Final  . Promyelocytes Absolute 12/12/2014 4   Final  . Blasts 12/12/2014 4   Final   CANCER CENTER CRITICAL VALUE PROTOCOL  . nRBC 12/12/2014 3* 0 /100 WBC Final  . Neutro Abs 12/12/2014 51.6* 1.4 - 6.5 K/uL Final  . Lymphs Abs 12/12/2014 9.3* 1.0 - 3.6 K/uL Final  . Monocytes Absolute 12/12/2014 5.3* 0.2 - 0.9 K/uL Final  . Eosinophils Absolute 12/12/2014 35.7* 0 - 0.7 K/uL Final  . Basophils Absolute 12/12/2014 25.1* 0 - 0.1 K/uL Final  . RBC Morphology 12/12/2014 MIXED RBC POPULATION   Final   Comment: MARKED POIKILOCYTOSIS POLYCHROMASIA PRESENT TARGET CELLS ELLIPTOCYTES   . WBC Morphology 12/12/2014 MARKED LEFT SHIFT (>5% METAS,MYELOS AND PROS, OCC BLAST NOTED)   Final   SMUDGE CELLS  . 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  I discussed situation with pathologists.  Phone calls has been also placed to discuss situation with pathologists in integrated oncology. Biopsy of lymph node would be considered to rule out any recurrent and progressive endometrial cancer  PLAN:  Start patient on Gleevec 400 mg daily Refer patient to Dr. Jamal Collin  for biopsy of cervical lymph node CT scan has been reviewed and shows enlarged spleen and liver and retroperitoneal lymphadenopathy Patient expressed understanding and was in agreement with this plan. She also understands that She can call clinic at any time with any questions, concerns, or complaints.    No matching staging information was found for the patient.  Forest Gleason, MD   12/13/2014 7:13 AM

## 2014-12-13 NOTE — Telephone Encounter (Signed)
This is a patient of Dr. Oliva Bustard.

## 2014-12-13 NOTE — Telephone Encounter (Signed)
FYI Received prescription for  Imantinib, however, transferred prescription to [facility].

## 2014-12-18 ENCOUNTER — Ambulatory Visit (INDEPENDENT_AMBULATORY_CARE_PROVIDER_SITE_OTHER): Payer: Medicare Other | Admitting: General Surgery

## 2014-12-18 ENCOUNTER — Ambulatory Visit: Payer: Medicare Other

## 2014-12-18 ENCOUNTER — Telehealth: Payer: Self-pay | Admitting: *Deleted

## 2014-12-18 ENCOUNTER — Encounter: Payer: Self-pay | Admitting: General Surgery

## 2014-12-18 VITALS — BP 122/74 | HR 90 | Resp 14 | Ht 66.0 in | Wt 143.0 lb

## 2014-12-18 DIAGNOSIS — Z8542 Personal history of malignant neoplasm of other parts of uterus: Secondary | ICD-10-CM | POA: Diagnosis not present

## 2014-12-18 DIAGNOSIS — R591 Generalized enlarged lymph nodes: Secondary | ICD-10-CM

## 2014-12-18 NOTE — Telephone Encounter (Signed)
PA # [id number].  Plan: Telford Nab  ID # [id number]

## 2014-12-18 NOTE — Patient Instructions (Addendum)
The patient is aware to call back for any questions or concerns. Keep are clean May remove dressing in 2-3 days

## 2014-12-18 NOTE — Telephone Encounter (Signed)
PA submitted to [facility]. Outcome pending. Final determination will be emailed or faxed within 24-72 hours.

## 2014-12-18 NOTE — Progress Notes (Signed)
Patient ID: Mikayla Keller, female   DOB: January 10, 1946, 69 y.o.   MRN: [id number]  Chief Complaint  Patient presents with  . Other    cervical lymph node    HPI Mikayla Keller is a 69 y.o. female.  Here today for evaluation of a cervical lymph node. She has a history of endometrial cancer in 2005. She states Dr Oliva Bustard wants a lymph node biopsied. She noticed the lymph node about one month ago. She states she has lost a little weight and has a poor appetite.  HPI  Past Medical History  Diagnosis Date  . 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 Hypertension   . Diabetes mellitus without complication   . Shortness of breath dyspnea   . History of chemotherapy 2005  . CML (chronic myelocytic leukemia)     Past Surgical History  Procedure Laterality Date  . Abdominal hysterectomy  2005    endometrial cancer  . Ovary surgery Bilateral   . Colonoscopy  2012 ?    Family History  Problem Relation Age of Onset  . Cancer Sister     breast ? late 8's  . Cancer Sister      breast mid 63's  . Cancer Cousin     lung late 24's /paternal cousin    Social History Social History  Substance Use Topics  . Smoking status: Former Smoker    Quit date: 12/06/1958  . Smokeless tobacco: Never Used  . Alcohol Use: No    No Known Allergies  Current Outpatient Prescriptions  Medication Sig Dispense Refill  . aspirin 81 MG tablet Take 81 mg by mouth daily.    Marland Kitchen atenolol (TENORMIN) 50 MG tablet     . glipiZIDE (GLUCOTROL) 10 MG tablet     . imatinib (GLEEVEC) 400 MG tablet Take 1 tablet (400 mg total) by mouth daily. Take with meals and large glass of water.Caution:Chemotherapy. 30 tablet 5  . lisinopril-hydrochlorothiazide (PRINZIDE,ZESTORETIC) 20-12.5 MG per tablet     . metFORMIN (GLUCOPHAGE) 1000 MG tablet     . pravastatin (PRAVACHOL) 40 MG tablet Take 40 mg by mouth daily.     No current facility-administered medications for this visit.    Review of Systems Review of  Systems  Constitutional: Positive for fatigue and unexpected weight change.  Respiratory: Negative.   Cardiovascular: Negative.     Blood pressure 122/74, pulse 90, resp. rate 14, height 5\' 6"  (1.676 m), weight 143 lb (64.864 kg).  Physical Exam Physical Exam  Constitutional: She is oriented to person, place, and time. She appears well-developed and well-nourished.  Eyes: Conjunctivae are normal. No scleral icterus.  Neck: Neck supple.    Cardiovascular: Normal rate, regular rhythm and normal heart sounds.   Pulmonary/Chest: Effort normal and breath sounds normal.  Abdominal: Soft. Bowel sounds are normal. There is splenomegaly. There is no hepatomegaly. There is no tenderness.    Lymphadenopathy:    She has cervical adenopathy.    She has axillary adenopathy.  Left axillary lymphadenopathy noted. Firm, mobile nodes-largest 2.5cm. 2cm mobile hard node left lower posterior neck. Large hard nodes left submandibular  Neurological: She is alert and oriented to person, place, and time.  Skin: Skin is warm and dry.  Psychiatric: Her behavior is normal.    Data Reviewed Oncology notes and CT report.   Assessment    History of endometrial/ovarian cancer, 2005. CML with lymphadenopathy and splenomegaly.     Plan   Discussed with Dr. Donetta Potts. He  wants to be sure that pt does not have metastatic endometrial cancer.  Core biopsy recommended. Explained to pt and she was agreeable.   Procedure- core biopsy of left submandibular node with US guidance.  Anesth: 5 ml 1% xylocaine, mixed with 0.5% marcaine  Device: 14 G Bard  Complication: none  Procedure well tolerated.  Dressing: steri strip, telfa and tegaderm.  She will be notified when pathology available       PCP:  Elton Sin Ref: Dr Eda Paschal 12/18/2014, 1:19 PM

## 2014-12-25 ENCOUNTER — Telehealth: Payer: Self-pay | Admitting: *Deleted

## 2014-12-25 DIAGNOSIS — C9210 Chronic myeloid leukemia, BCR/ABL-positive, not having achieved remission: Secondary | ICD-10-CM

## 2014-12-25 DIAGNOSIS — Z5189 Encounter for other specified aftercare: Secondary | ICD-10-CM

## 2014-12-25 MED ORDER — PREDNISONE 20 MG PO TABS: 20.0000 mg | ORAL_TABLET | Freq: Every day | ORAL | Status: DC

## 2014-12-25 MED ORDER — ALLOPURINOL 100 MG PO TABS: 200.0000 mg | ORAL_TABLET | Freq: Every day | ORAL | Status: DC

## 2014-12-25 NOTE — Telephone Encounter (Signed)
Per Dr Oliva Bustard Prednisone and Allopurinol e scribed adn per [place], [facility] has been trying to reach pt and she has not answered so that delivery can be arranged. Her co pay is $2.00. I have attempted to call patient back several times on both the home and cell phone numbers, but get a busy signal on home phone and no vm on cell.

## 2014-12-25 NOTE — Telephone Encounter (Signed)
I spoke with patient and informed her of 2 rx at [facility] and that she needs to call [facility] to arrange for shipment of her drug, I gave her their phone number to call and she repeated it back to me. She made an appt for 8/31 at 9 am and repeated back to me that she is to take 3 new rx which I answered yes

## 2014-12-25 NOTE — Telephone Encounter (Signed)
Called to report that it has been 3 weeks and the med which was ordered for her has not arrived. She states she can hardly eat and she is losing a lot of weight.

## 2014-12-30 ENCOUNTER — Telehealth: Payer: Self-pay | Admitting: *Deleted

## 2014-12-30 NOTE — Telephone Encounter (Signed)
myrisk genetic testing cancelled due to active CML. Unable to use sample provided.

## 2015-01-08 ENCOUNTER — Inpatient Hospital Stay: Payer: Medicare Other

## 2015-01-08 ENCOUNTER — Inpatient Hospital Stay (HOSPITAL_BASED_OUTPATIENT_CLINIC_OR_DEPARTMENT_OTHER): Payer: Medicare Other | Admitting: Oncology

## 2015-01-08 VITALS — BP 113/75 | HR 91 | Temp 95.9°F | Wt 148.0 lb

## 2015-01-08 DIAGNOSIS — Z5189 Encounter for other specified aftercare: Secondary | ICD-10-CM

## 2015-01-08 DIAGNOSIS — R590 Localized enlarged lymph nodes: Secondary | ICD-10-CM

## 2015-01-08 DIAGNOSIS — Z79899 Other long term (current) drug therapy: Secondary | ICD-10-CM

## 2015-01-08 DIAGNOSIS — Z8543 Personal history of malignant neoplasm of ovary: Secondary | ICD-10-CM

## 2015-01-08 DIAGNOSIS — C9210 Chronic myeloid leukemia, BCR/ABL-positive, not having achieved remission: Secondary | ICD-10-CM

## 2015-01-08 DIAGNOSIS — R161 Splenomegaly, not elsewhere classified: Secondary | ICD-10-CM | POA: Diagnosis not present

## 2015-01-08 DIAGNOSIS — D509 Iron deficiency anemia, unspecified: Secondary | ICD-10-CM | POA: Diagnosis not present

## 2015-01-08 LAB — COMPREHENSIVE METABOLIC PANEL
ALBUMIN: 3 g/dL — AB (ref 3.5–5.0)
ALT: 21 U/L (ref 14–54)
AST: 15 U/L (ref 15–41)
Alkaline Phosphatase: 93 U/L (ref 38–126)
Anion gap: 6 (ref 5–15)
BUN: 34 mg/dL — ABNORMAL HIGH (ref 6–20)
CHLORIDE: 99 mmol/L — AB (ref 101–111)
CO2: 23 mmol/L (ref 22–32)
CREATININE: 0.83 mg/dL (ref 0.44–1.00)
Calcium: 7.8 mg/dL — ABNORMAL LOW (ref 8.9–10.3)
GFR calc Af Amer: >60 mL/min (ref >60)
GFR calc non Af Amer: >60 mL/min (ref >60)
GLUCOSE: 203 mg/dL — AB (ref 65–99)
Potassium: 4.7 mmol/L (ref 3.5–5.1)
SODIUM: 128 mmol/L — AB (ref 135–145)
Total Bilirubin: 0.7 mg/dL (ref 0.3–1.2)
Total Protein: 6.4 g/dL — ABNORMAL LOW (ref 6.5–8.1)

## 2015-01-08 LAB — CBC WITH DIFFERENTIAL/PLATELET
BASOS ABS: 0.3 10*3/uL — AB (ref 0–0.1)
Eosinophils Absolute: 0.5 10*3/uL (ref 0–0.7)
Eosinophils Relative: 1 %
HCT: 26 % — ABNORMAL LOW (ref 35.0–47.0)
HEMOGLOBIN: 8.6 g/dL — AB (ref 12.0–16.0)
Lymphocytes Relative: 17 %
Lymphs Abs: 6.7 10*3/uL — ABNORMAL HIGH (ref 1.0–3.6)
MCH: 30.2 pg (ref 26.0–34.0)
MCHC: 32.9 g/dL (ref 32.0–36.0)
MCV: 91.8 fL (ref 80.0–100.0)
Monocytes Absolute: 0.9 10*3/uL (ref 0.2–0.9)
Monocytes Relative: 2 %
NEUTROS ABS: 31.9 10*3/uL — AB (ref 1.4–6.5)
Neutrophils Relative %: 79 %
Platelets: 113 10*3/uL — ABNORMAL LOW (ref 150–440)
RBC: 2.83 MIL/uL — AB (ref 3.80–5.20)
RDW: 23.8 % — ABNORMAL HIGH (ref 11.5–14.5)
WBC: 40.2 10*3/uL — AB (ref 3.6–11.0)

## 2015-01-08 MED ORDER — PREDNISONE 5 MG PO TABS: 5.0000 mg | ORAL_TABLET | Freq: Every day | ORAL | Status: DC

## 2015-01-08 NOTE — Progress Notes (Signed)
Patient does not have living will.  Former smoker. Complains of diarrhea for past 2 days.

## 2015-01-10 ENCOUNTER — Encounter: Payer: Self-pay | Admitting: Oncology

## 2015-01-10 NOTE — Progress Notes (Signed)
Mikayla Keller @ [facility] [phone]  [phone]   INITIAL CONSULT  Mikayla Keller OB: February 03, 1946  MR#: [id number]  KGU#:[id number]  Patient Care Team: Mikayla Sin, DO Mikayla PCP - General (Internal Medicine) Mikayla Lye, MD Mikayla Surgeon (General Surgery) Mikayla Gleason, MD (Oncology)  CHIEF COMPLAINT:  Chief Complaint  Patient presents with  . Follow-up   chronic myelogenous leukemia .  Patient has been started on  GLEEVAC 400 mg daily. (August, 2016)  VISIT DIAGNOSIS:     ICD-9-CM ICD-10-CM   1. CML (chronic myeloid leukemia) 205.10 C92.90 predniSONE (DELTASONE) 5 MG tablet     CBC with Differential     Comprehensive metabolic panel     Lactate dehydrogenase       69 year old lady presented with leukocytosis, anemia, and significant weight loss of approximately 20 pounds in last few months. Asian had a previous history of ovarian cancer diagnosed in 2005 received chemotherapy with carboplatinum and Abraxane 6 INTERVAL HISTORY:  69 year old lady was referred Mikayla Keller to me because of leukocytosis and anemia and thrombocytopenia.  According to patient has lost 16 pounds of weight.  Patient feels hungry all the time.  No abdominal discomfort.  Appetite has been stable.  No nausea.  No vomiting.  Has noticed mild abdominal distention. Getting regular mammogram done December 12, 2014 Patient is here for further follow-up.  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  I will discuss this situation with pathologist.  I discussed situation pathologist locally.  Hemoglobin is improved.  Patient is afebrile.  Has a palpable lymph node which is increase in size in the neck.  January 09, 2015 Patient has been started on gleevac 400 mg daily Tolerating treatment very well.  Appetite has improved patient was on prednisone.  Here for further follow-up and  treatment consideration.  No nausea.  No vomiting.  No diarrhea. White count has improved to 40,000 hemoglobin continues to be low We have a lot of difficulty getting insurance approval for medication REVIEW OF SYSTEMS:   GENERAL: Feels weak losing weight and feels hungry all the time PERFORMANCE STATUS (ECOG): 01 HEENT:  No visual changes, runny nose, sore throat, mouth sores or tenderness. Lungs: No shortness of breath or cough.  No hemoptysis. Cardiac:  No chest pain, palpitations, orthopnea, or PND. GI:  Mikayla Keller. GU:  No urgency, frequency, dysuria, or hematuria. Musculoskeletal:  No Mikayla Keller pain.  No joint pain.  No muscle tenderness. Extremities:  No pain or swelling. Skin:  No rashes or skin changes. Neuro:  No headache, numbness or weakness, balance or coordination issues. Endocrine:  No diabetes, thyroid issues, hot flashes or night sweats. Psych:  No mood changes, depression or anxiety. Pain:  No focal pain. Review of systems:  All other systems reviewed and found to be negative. Mikayla per HPI. Mikayla Keller.  PAST MEDICAL HISTORY: Past Medical History  Diagnosis Date  . 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 Hypertension   . Diabetes mellitus without complication   . Shortness of breath dyspnea   . History of chemotherapy 2005  . CML (chronic myelocytic leukemia)    I hypertension.  Diabetes.  PAST SURGICAL HISTORY: Surgical history is positive for ovarian cancer FAMILY HISTORY Patient had 3 sisters one died of Mikayla Keller cancer.  One died of heart failure or.    Has 2  boys 68 and 69 years old no significant illness      ADVANCED DIRECTIVES:  Patient does not have any living will or healthcare power of attorney.  Information was given .  Available resources had been discussed.  We will follow-up on subsequent appointments regarding this issue HEALTH MAINTENANCE: Social History  Substance Use Topics  .  Smoking status: Former Smoker    Quit date: 12/06/1958  . Smokeless tobacco: Never Used  . Alcohol Use: No    Patient did not have any colonoscopy done Mammogram  Has  been scheduled next month No Known Allergies  Current Outpatient Prescriptions  Medication Sig Dispense Refill  . allopurinol (ZYLOPRIM) 100 MG tablet Take 2 tablets (200 mg total) by mouth daily. 30 tablet 0  . aspirin 81 MG tablet Take 81 mg by mouth daily.    Marland Kitchen atenolol (TENORMIN) 50 MG tablet     . glipiZIDE (GLUCOTROL) 10 MG tablet     . imatinib (GLEEVEC) 400 MG tablet Take 1 tablet (400 mg total) by mouth daily. Take with meals and large glass of water.Caution:Chemotherapy. 30 tablet 5  . lisinopril-hydrochlorothiazide (PRINZIDE,ZESTORETIC) 20-12.5 MG per tablet     . metFORMIN (GLUCOPHAGE) 1000 MG tablet     . pravastatin (PRAVACHOL) 40 MG tablet Take 40 mg by mouth daily.    . predniSONE (DELTASONE) 20 MG tablet     . predniSONE (DELTASONE) 5 MG tablet Take 1 tablet (5 mg total) by mouth daily with breakfast. Take for 30 days then stop. 30 tablet 0   No current facility-administered medications for this visit.    OBJECTIVE: Keller EXAM: GENERAL:  Well developed, well nourished, sitting comfortably in the exam room in no acute distress. MENTAL STATUS:  Alert and oriented to person, place and time. HEAD:    Normocephalic, atraumatic, face symmetric, no Cushingoid features. Mikayla Keller  Pupils equal round and reactive to light and accomodation.  No conjunctivitis or scleral icteruses.. Mikayla Keller:  Oropharynx clear without lesion.  Tongue normal. Mucous membranes moist.. Palpable left submandibular lymph node versus gland  Small palpable lymph node in the left cervical area less than a centimeter Mikayla Keller:  Clear to auscultation without rales, wheezes or rhonchi. Mikayla Keller:  Regular rate and rhythm without murmur, rub or gallop. Mikayla Keller:  Right Mikayla Keller without masses, skin changes or nipple discharge.  Left Mikayla Keller  without masses, skin changes or nipple discharge. Mikayla Keller:  Mild distention.  No ascites.  Bowel sounds are present.  Palpable spleen has decreased in size liver is not palpable Mikayla Keller:  No CVA tenderness.  No tenderness on percussion of the Mikayla Keller or rib cage. SKIN:  No rashes, ulcers or lesions. EXTREMITIES: No edema, no skin discoloration or tenderness.  No palpable cords. LYMPH NODES: Palpable lymph node has decreased in the neck and submandibular area NEUROLOGICAL: Unremarkable. PSYCH:  Appropriate.  Filed Vitals:   01/08/15 0930  BP: 113/75  Pulse: 91  Temp: 95.9 F (35.5 C)     Body mass index is 23.9 kg/(m^2).    ECOG FS:1 - Symptomatic but completely ambulatory  LAB RESULTS:  Appointment on 01/08/2015  Component Date Value Ref Range Status  . WBC 01/08/2015 40.2* 3.6 - 11.0 K/uL Final  . RBC 01/08/2015 2.83* 3.80 - 5.20 MIL/uL Final  . Hemoglobin 01/08/2015 8.6* 12.0 - 16.0 g/dL Final  . HCT 01/08/2015 26.0* 35.0 - 47.0 % Final  . MCV 01/08/2015 91.8  80.0 - 100.0 fL Final  . MCH 01/08/2015 30.2  26.0 - 34.0  pg Final  . MCHC 01/08/2015 32.9  32.0 - 36.0 g/dL Final  . RDW 01/08/2015 23.8* 11.5 - 14.5 % Final  . Platelets 01/08/2015 113* 150 - 440 K/uL Final  . Neutrophils Relative % 01/08/2015 79%   Final  . Neutro Abs 01/08/2015 31.9* 1.4 - 6.5 K/uL Final  . Lymphocytes Relative 01/08/2015 17%   Final  . Lymphs Abs 01/08/2015 6.7* 1.0 - 3.6 K/uL Final  . Monocytes Relative 01/08/2015 2%   Final  . Monocytes Absolute 01/08/2015 0.9  0.2 - 0.9 K/uL Final  . Eosinophils Relative 01/08/2015 1%   Final  . Eosinophils Absolute 01/08/2015 0.5  0 - 0.7 K/uL Final  . Basophils Relative 01/08/2015 1%   Final  . Basophils Absolute 01/08/2015 0.3* 0 - 0.1 K/uL Final  . Smear Review 01/08/2015 RARE NRBCs   Corrected  . Sodium 01/08/2015 128* 135 - 145 mmol/L Final  . Potassium 01/08/2015 4.7  3.5 - 5.1 mmol/L Final  . Chloride 01/08/2015 99* 101 - 111 mmol/L Final  . CO2  01/08/2015 23  22 - 32 mmol/L Final  . Glucose, Bld 01/08/2015 203* 65 - 99 mg/dL Final  . BUN 01/08/2015 34* 6 - 20 mg/dL Final  . Creatinine, Ser 01/08/2015 0.83  0.44 - 1.00 mg/dL Final  . Calcium 01/08/2015 7.8* 8.9 - 10.3 mg/dL Final  . Total Protein 01/08/2015 6.4* 6.5 - 8.1 g/dL Final  . Albumin 01/08/2015 3.0* 3.5 - 5.0 g/dL Final  . AST 01/08/2015 15  15 - 41 U/L Final  . ALT 01/08/2015 21  14 - 54 U/L Final  . Alkaline Phosphatase 01/08/2015 93  38 - 126 U/L Final  . Total Bilirubin 01/08/2015 0.7  0.3 - 1.2 mg/dL Final  . GFR calc non Af Amer 01/08/2015 >60  >60 mL/min Final  . GFR calc Af Amer 01/08/2015 >60  >60 mL/min Final   Comment: (NOTE) The eGFR has been calculated using the CKD EPI equation. This calculation has not been validated in all clinical situations. eGFR's persistently <60 mL/min signify possible Chronic Kidney Disease.   . Anion gap 01/08/2015 6  5 - 15 Final     ASSESSMENT:  Chronic myelogenous leukemia responding to believe that at present time After 4 more weeks of Gleevec reassessment for BCR ABL activity in  Peripheral blood. Patient will Be tapered off from prednisone to 5 mg daily Reevaluate patient in 4 weeks   PLAN:  Start patient on Gleevec 400 mg daily Lymph node biopsies consistent with   Myeloid infiltration.Total duration of visit was 25 minutes.  50% or more time was spent in counseling patient and family regarding prognosis and options of treatment and available resourcesOverall patient is responding to the treatment and appetite is improving.  Continue follow-up without any intervention  CT scan has been reviewed and shows enlarged spleen and liver and retroperitoneal lymphadenopathy Patient expressed understanding and was in agreement with this plan. She also understands that She can call clinic at any time with any questions, concerns, or complaints.    No matching staging information was found for the patient.  Mikayla Gleason,  MD   01/10/2015 9:08 AM

## 2015-01-31 ENCOUNTER — Encounter: Payer: Self-pay | Admitting: Emergency Medicine

## 2015-01-31 ENCOUNTER — Inpatient Hospital Stay
Admission: EM | Admit: 2015-01-31 | Discharge: 2015-02-03 | DRG: 812 | Disposition: A | Discharge status: Skilled Nursing Facility | Payer: Medicare Other | Attending: Internal Medicine | Admitting: Internal Medicine

## 2015-01-31 ENCOUNTER — Emergency Department: Payer: Medicare Other

## 2015-01-31 DIAGNOSIS — Z87891 Personal history of nicotine dependence: Secondary | ICD-10-CM

## 2015-01-31 DIAGNOSIS — L8990 Pressure ulcer of unspecified site, unspecified stage: Secondary | ICD-10-CM | POA: Insufficient documentation

## 2015-01-31 DIAGNOSIS — Z90710 Acquired absence of both cervix and uterus: Secondary | ICD-10-CM | POA: Diagnosis not present

## 2015-01-31 DIAGNOSIS — Z9221 Personal history of antineoplastic chemotherapy: Secondary | ICD-10-CM

## 2015-01-31 DIAGNOSIS — C9210 Chronic myeloid leukemia, BCR/ABL-positive, not having achieved remission: Secondary | ICD-10-CM

## 2015-01-31 DIAGNOSIS — I10 Essential (primary) hypertension: Secondary | ICD-10-CM | POA: Diagnosis present

## 2015-01-31 DIAGNOSIS — Z8542 Personal history of malignant neoplasm of other parts of uterus: Secondary | ICD-10-CM

## 2015-01-31 DIAGNOSIS — Z8543 Personal history of malignant neoplasm of ovary: Secondary | ICD-10-CM | POA: Diagnosis not present

## 2015-01-31 DIAGNOSIS — D696 Thrombocytopenia, unspecified: Secondary | ICD-10-CM | POA: Diagnosis present

## 2015-01-31 DIAGNOSIS — I959 Hypotension, unspecified: Secondary | ICD-10-CM | POA: Diagnosis present

## 2015-01-31 DIAGNOSIS — E871 Hypo-osmolality and hyponatremia: Secondary | ICD-10-CM | POA: Diagnosis present

## 2015-01-31 DIAGNOSIS — K921 Melena: Secondary | ICD-10-CM

## 2015-01-31 DIAGNOSIS — W19XXXA Unspecified fall, initial encounter: Secondary | ICD-10-CM | POA: Diagnosis present

## 2015-01-31 DIAGNOSIS — Z7982 Long term (current) use of aspirin: Secondary | ICD-10-CM | POA: Diagnosis not present

## 2015-01-31 DIAGNOSIS — D500 Iron deficiency anemia secondary to blood loss (chronic): Secondary | ICD-10-CM | POA: Diagnosis present

## 2015-01-31 DIAGNOSIS — Z803 Family history of malignant neoplasm of breast: Secondary | ICD-10-CM | POA: Diagnosis not present

## 2015-01-31 DIAGNOSIS — Z79899 Other long term (current) drug therapy: Secondary | ICD-10-CM

## 2015-01-31 DIAGNOSIS — Z801 Family history of malignant neoplasm of trachea, bronchus and lung: Secondary | ICD-10-CM

## 2015-01-31 DIAGNOSIS — D649 Anemia, unspecified: Secondary | ICD-10-CM | POA: Diagnosis not present

## 2015-01-31 DIAGNOSIS — R000 Tachycardia, unspecified: Secondary | ICD-10-CM | POA: Diagnosis present

## 2015-01-31 DIAGNOSIS — E1165 Type 2 diabetes mellitus with hyperglycemia: Secondary | ICD-10-CM | POA: Diagnosis present

## 2015-01-31 DIAGNOSIS — D72819 Decreased white blood cell count, unspecified: Secondary | ICD-10-CM | POA: Diagnosis present

## 2015-01-31 DIAGNOSIS — R42 Dizziness and giddiness: Secondary | ICD-10-CM | POA: Diagnosis present

## 2015-01-31 DIAGNOSIS — C9290 Myeloid leukemia, unspecified, not having achieved remission: Secondary | ICD-10-CM | POA: Diagnosis present

## 2015-01-31 LAB — URINALYSIS COMPLETE WITH MICROSCOPIC (ARMC ONLY)
BACTERIA UA: NONE SEEN
Bilirubin Urine: NEGATIVE
Glucose, UA: NEGATIVE mg/dL
Hgb urine dipstick: NEGATIVE
Ketones, ur: NEGATIVE mg/dL
Leukocytes, UA: NEGATIVE
Nitrite: NEGATIVE
Protein, ur: NEGATIVE mg/dL
RBC / HPF: NONE SEEN RBC/hpf (ref 0–5)
Specific Gravity, Urine: 1.012 (ref 1.005–1.030)
pH: 7 (ref 5.0–8.0)

## 2015-01-31 LAB — COMPREHENSIVE METABOLIC PANEL
ALT: 7 U/L — ABNORMAL LOW (ref 14–54)
ANION GAP: 9 (ref 5–15)
AST: 14 U/L — ABNORMAL LOW (ref 15–41)
Albumin: 3 g/dL — ABNORMAL LOW (ref 3.5–5.0)
Alkaline Phosphatase: 56 U/L (ref 38–126)
BUN: 17 mg/dL (ref 6–20)
CALCIUM: 8.3 mg/dL — AB (ref 8.9–10.3)
CO2: 20 mmol/L — ABNORMAL LOW (ref 22–32)
Chloride: 102 mmol/L (ref 101–111)
Creatinine, Ser: 0.81 mg/dL (ref 0.44–1.00)
GFR calc non Af Amer: >60 mL/min (ref >60)
Glucose, Bld: 158 mg/dL — ABNORMAL HIGH (ref 65–99)
POTASSIUM: 4.5 mmol/L (ref 3.5–5.1)
Sodium: 131 mmol/L — ABNORMAL LOW (ref 135–145)
Total Bilirubin: 1.8 mg/dL — ABNORMAL HIGH (ref 0.3–1.2)
Total Protein: 6.6 g/dL (ref 6.5–8.1)

## 2015-01-31 LAB — IRON AND TIBC
Iron: 44 ug/dL (ref 28–170)
SATURATION RATIOS: 26 % (ref 10.4–31.8)
TIBC: 166 ug/dL — ABNORMAL LOW (ref 250–450)
UIBC: 123 ug/dL

## 2015-01-31 LAB — CBC WITH DIFFERENTIAL/PLATELET
Band Neutrophils: 4 %
Basophils Absolute: 7.7 10*3/uL — ABNORMAL HIGH (ref 0–0.1)
Basophils Relative: 9 %
Blasts: 4 %
EOS PCT: 35 %
Eosinophils Absolute: 29.8 10*3/uL — ABNORMAL HIGH (ref 0–0.7)
HEMATOCRIT: 13.8 % — AB (ref 35.0–47.0)
Hemoglobin: 4.2 g/dL — CL (ref 12.0–16.0)
LYMPHS PCT: 15 %
Lymphs Abs: 12.8 10*3/uL — ABNORMAL HIGH (ref 1.0–3.6)
MCH: 35.1 pg — ABNORMAL HIGH (ref 26.0–34.0)
MCHC: 30.8 g/dL — AB (ref 32.0–36.0)
MCV: 114 fL — ABNORMAL HIGH (ref 80.0–100.0)
Metamyelocytes Relative: 0 %
Monocytes Absolute: 2.6 10*3/uL — ABNORMAL HIGH (ref 0.2–0.9)
Monocytes Relative: 3 %
Myelocytes: 1 %
NEUTROS PCT: 29 %
NRBC: 6 /100{WBCs} — AB
Neutro Abs: 29 10*3/uL — ABNORMAL HIGH (ref 1.4–6.5)
OTHER: 0 %
PROMYELOCYTES ABS: 0 %
Platelets: 24 10*3/uL — CL (ref 150–440)
RBC: 1.21 MIL/uL — ABNORMAL LOW (ref 3.80–5.20)
RDW: 30.7 % — AB (ref 11.5–14.5)
WBC: 85.2 10*3/uL (ref 3.6–11.0)

## 2015-01-31 LAB — GLUCOSE, CAPILLARY: Glucose-Capillary: 144 mg/dL — ABNORMAL HIGH (ref 65–99)

## 2015-01-31 LAB — MRSA PCR SCREENING: MRSA by PCR: NEGATIVE

## 2015-01-31 LAB — PREPARE RBC (CROSSMATCH)

## 2015-01-31 LAB — RETICULOCYTES
RBC.: 1.22 MIL/uL — ABNORMAL LOW (ref 3.80–5.20)
RETIC COUNT ABSOLUTE: 72 10*3/uL (ref 19.0–183.0)
Retic Ct Pct: 5.9 % — ABNORMAL HIGH (ref 0.4–3.1)

## 2015-01-31 LAB — FERRITIN: FERRITIN: 1422 ng/mL — AB (ref 11–307)

## 2015-01-31 LAB — TROPONIN I: TROPONIN I: 0.03 ng/mL (ref <0.031)

## 2015-01-31 LAB — ABO/RH: ABO/RH(D): O POS

## 2015-01-31 MED ORDER — SODIUM CHLORIDE 0.9 % IV SOLN
10.0000 mL/h | Freq: Once | INTRAVENOUS | Status: AC
  Administered 2015-01-31: 10 mL/h via INTRAVENOUS

## 2015-01-31 MED ORDER — SODIUM CHLORIDE 0.9 % IV SOLN
80.0000 mg | Freq: Once | INTRAVENOUS | Status: AC
  Administered 2015-01-31: 80 mg via INTRAVENOUS
  Filled 2015-01-31: qty 80

## 2015-01-31 MED ORDER — FUROSEMIDE 10 MG/ML IJ SOLN
40.0000 mg | Freq: Once | INTRAMUSCULAR | Status: AC
  Administered 2015-02-01: 40 mg via INTRAVENOUS
  Filled 2015-01-31: qty 4

## 2015-01-31 MED ORDER — IMATINIB MESYLATE 400 MG PO TABS: 400.0000 mg | ORAL_TABLET | Freq: Every day | ORAL | Status: DC

## 2015-01-31 MED ORDER — FUROSEMIDE 10 MG/ML IJ SOLN
20.0000 mg | Freq: Once | INTRAMUSCULAR | Status: AC
  Administered 2015-02-01: 20 mg via INTRAVENOUS
  Filled 2015-01-31: qty 2

## 2015-01-31 MED ORDER — SODIUM CHLORIDE 0.9 % IV SOLN
8.0000 mg/h | INTRAVENOUS | Status: DC
  Administered 2015-01-31 – 2015-02-01 (×2): 8 mg/h via INTRAVENOUS
  Filled 2015-01-31 (×5): qty 80

## 2015-01-31 MED ORDER — SODIUM CHLORIDE 0.9 % IV BOLUS (SEPSIS)
1000.0000 mL | Freq: Once | INTRAVENOUS | Status: AC
  Administered 2015-01-31: 1000 mL via INTRAVENOUS

## 2015-01-31 MED ORDER — ACETAMINOPHEN 325 MG PO TABS
650.0000 mg | ORAL_TABLET | Freq: Four times a day (QID) | ORAL | Status: DC | PRN
Start: 2015-01-31 — End: 2015-02-03

## 2015-01-31 MED ORDER — ACETAMINOPHEN 650 MG RE SUPP: 650.0000 mg | Freq: Four times a day (QID) | RECTAL | Status: DC | PRN

## 2015-01-31 MED ORDER — PREDNISONE 5 MG PO TABS
5.0000 mg | ORAL_TABLET | Freq: Every day | ORAL | Status: DC
  Administered 2015-02-01 – 2015-02-03 (×3): 5 mg via ORAL
  Filled 2015-01-31: qty 2
  Filled 2015-01-31 (×2): qty 1

## 2015-01-31 MED ORDER — SODIUM CHLORIDE 0.9 % IJ SOLN
3.0000 mL | Freq: Two times a day (BID) | INTRAMUSCULAR | Status: DC
  Administered 2015-01-31 – 2015-02-03 (×5): 3 mL via INTRAVENOUS

## 2015-01-31 MED ORDER — PANTOPRAZOLE SODIUM 40 MG IV SOLR: 40.0000 mg | Freq: Two times a day (BID) | INTRAVENOUS | Status: DC

## 2015-01-31 MED ORDER — PRAVASTATIN SODIUM 20 MG PO TABS
40.0000 mg | ORAL_TABLET | Freq: Every day | ORAL | Status: DC
  Administered 2015-02-01 – 2015-02-02 (×3): 40 mg via ORAL
  Filled 2015-01-31 (×3): qty 2

## 2015-01-31 NOTE — ED Provider Notes (Signed)
CSN: [id number]     Arrival date & time 01/31/15  1335 History   First MD Initiated Contact with Patient 01/31/15 1421     Chief Complaint  Patient presents with  . Hyperglycemia  . Dizziness     (Consider location/radiation/quality/duration/timing/severity/associated sxs/prior Treatment) The history is provided by the patient.  Mikayla Keller is a 69 y.o. female hx of ovarian cancer, HTN, CML, here with weakness, falls. Patient is here because she does does not feel well. She has lightheaded and dizziness over the last week or so. Patient states that she had a bone marrow biopsy about 2 weeks ago and was started on prednisone, Gleevec. She has poor appetite since then and just feels diffusely weak. She hasn't falling often and feels very lightheaded and dizzy prior to that but denies any syncope. The most recent fall was a Monday and she fell and hit her head and was noted to have a bruise on the right forehead. Denies any fevers or chills. She also has been having poor appetite and has been drinking more sodas and not eating much. Also uncompliant with her diabetes medications.    Past Medical History  Diagnosis Date  . 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 Hypertension   . Diabetes mellitus without complication   . Shortness of breath dyspnea   . History of chemotherapy 2005  . CML (chronic myelocytic leukemia)    Past Surgical History  Procedure Laterality Date  . Abdominal hysterectomy  2005    endometrial cancer  . Ovary surgery Bilateral   . Colonoscopy  2012 ?   Family History  Problem Relation Age of Onset  . Cancer Sister     breast ? late 53's  . Cancer Sister      breast mid 72's  . Cancer Cousin     lung late 23's /paternal cousin   Social History  Substance Use Topics  . Smoking status: Former Smoker    Quit date: 12/06/1958  . Smokeless tobacco: Never Used  . Alcohol Use: No   OB History    Gravida Para Term Preterm AB TAB SAB Ectopic Multiple  Living   3 3              Obstetric Comments   1st Menstrual Cycle:  16  1st Pregnancy:  19     Review of Systems  Neurological: Positive for weakness and light-headedness.  All other systems reviewed and are negative.     Allergies  Review of patient's allergies indicates no known allergies.  Home Medications   Prior to Admission medications   Medication Sig Start Date End Date Taking? Authorizing Provider  atenolol (TENORMIN) 50 MG tablet Take 50 mg by mouth daily.   Yes Historical Provider, MD  imatinib (GLEEVEC) 400 MG tablet Take 1 tablet (400 mg total) by mouth daily. Take with meals and large glass of water.Caution:Chemotherapy. 12/12/14  Yes Evlyn Kanner, NP  pravastatin (PRAVACHOL) 40 MG tablet Take 40 mg by mouth at bedtime.    Yes Historical Provider, MD  predniSONE (DELTASONE) 5 MG tablet Take 1 tablet (5 mg total) by mouth daily with breakfast. Take for 30 days then stop. Patient taking differently: Take 5 mg by mouth daily with breakfast.  01/08/15  Yes Forest Gleason, MD  allopurinol (ZYLOPRIM) 100 MG tablet Take 2 tablets (200 mg total) by mouth daily. 12/25/14   Forest Gleason, MD  aspirin 81 MG tablet Take 81 mg by mouth daily.  Historical Provider, MD  glipiZIDE (GLUCOTROL) 10 MG tablet  09/16/14   Historical Provider, MD  lisinopril-hydrochlorothiazide (PRINZIDE,ZESTORETIC) 20-12.5 MG per tablet  11/21/14   Historical Provider, MD  metFORMIN (GLUCOPHAGE) 1000 MG tablet  09/04/14   Historical Provider, MD   BP 136/72 mmHg  Pulse 103  Temp(Src) 98.6 F (37 C) (Oral)  Resp 18  Ht _0  (1.676 m)  Wt 146 lb (66.225 kg)  BMI 23.58 kg/m2  SpO2 97% Physical Exam  Constitutional: She is oriented to person, place, and time.  Chronically ill, slightly dehydrated   HENT:  Head: Normocephalic.  MM dry. Bruise R temple area   Eyes: Conjunctivae are normal. Pupils are equal, round, and reactive to light.  Neck: Normal range of motion. Neck supple.  Cardiovascular:  Regular rhythm and normal heart sounds.   Slightly tachy   Pulmonary/Chest: Effort normal and breath sounds normal. No respiratory distress. She has no wheezes. She has no rales.  Abdominal: Soft. Bowel sounds are normal. She exhibits no distension. There is no tenderness. There is no rebound.  Musculoskeletal: Normal range of motion. She exhibits no edema or tenderness.  Neurological: She is alert and oriented to person, place, and time. No cranial nerve deficit. Coordination normal.  Skin: Skin is warm and dry.  Psychiatric: She has a normal mood and affect. Her behavior is normal. Judgment and thought content normal.  Nursing note and vitals reviewed.   ED Course  Procedures (including critical care time)   CRITICAL CARE Performed by: Darl Householder, DAVID   Total critical care time: 30 min   Critical care time was exclusive of separately billable procedures and treating other patients.  Critical care was necessary to treat or prevent imminent or life-threatening deterioration.  Critical care was time spent personally by me on the following activities: development of treatment plan with patient and/or surrogate as well as nursing, discussions with consultants, evaluation of patient's response to treatment, examination of patient, obtaining history from patient or surrogate, ordering and performing treatments and interventions, ordering and review of laboratory studies, ordering and review of radiographic studies, pulse oximetry and re-evaluation of patient's condition.   Labs Review Labs Reviewed  COMPREHENSIVE METABOLIC PANEL - Abnormal; Notable for the following:    Sodium 131 (*)    CO2 20 (*)    Glucose, Bld 158 (*)    Calcium 8.3 (*)    Albumin 3.0 (*)    AST 14 (*)    ALT 7 (*)    Total Bilirubin 1.8 (*)    All other components within normal limits  CBC WITH DIFFERENTIAL/PLATELET - Abnormal; Notable for the following:    WBC 85.2 (*)    RBC 1.21 (*)    Hemoglobin 4.2 (*)     HCT 13.8 (*)    MCV 114.0 (*)    MCH 35.1 (*)    MCHC 30.8 (*)    RDW 30.7 (*)    Platelets 24 (*)    All other components within normal limits  URINALYSIS COMPLETEWITH MICROSCOPIC ([facility] ONLY) - Abnormal; Notable for the following:    Color, Urine YELLOW (*)    APPearance CLEAR (*)    Squamous Epithelial / LPF 6-30 (*)    All other components within normal limits  TROPONIN I  PREPARE RBC (CROSSMATCH)  TYPE AND SCREEN    Imaging Review Ct Head Wo Contrast  01/31/2015   CLINICAL DATA:  Dizziness.  EXAM: CT HEAD WITHOUT CONTRAST  TECHNIQUE: Contiguous axial images were obtained  from the base of the skull through the vertex without intravenous contrast.  COMPARISON:  None.  FINDINGS: Bony calvarium appears intact. No mass effect or midline shift is noted. Ventricular size is within normal limits. There is no evidence of mass lesion, hemorrhage or acute infarction.  IMPRESSION: Normal head CT.   Electronically Signed   By: Marijo Conception, M.D.   On: 01/31/2015 16:08   Dg Abd Acute W/chest  01/31/2015   CLINICAL DATA:  Acute left lower quadrant abdominal pain.  EXAM: DG ABDOMEN ACUTE W/ 1V CHEST  COMPARISON:  CT scan of November 27, 2014.  FINDINGS: Stable cardiomediastinal silhouette. Mild bilateral pleural effusions are noted. No pneumoperitoneum is noted. Surgical clips are noted in the pelvis. Enlarged spleen is noted on the left. There is no evidence of bowel obstruction or ileus.  IMPRESSION: Splenomegaly.  No evidence of bowel obstruction or ileus.   Electronically Signed   By: Marijo Conception, M.D.   On: 01/31/2015 15:44   I have personally reviewed and evaluated these images and lab results as part of my medical decision-making.   EKG Interpretation None       ED ECG REPORT I, YAO, DAVID, the attending physician, personally viewed and interpreted this ECG.   Date: 01/31/2015  EKG Time: 15:14   Rate: 103  Rhythm: normal EKG, normal sinus rhythm, unchanged from previous tracings,  sinus tachycardia  Axis: normal  Intervals:none  ST&T Change: none   MDM   Final diagnoses:  None   Gentry Seeber is a 69 y.o. female here with weakness, fatigue. Consider blast crisis vs UTI vs pneumonia vs hyperglycemia vs dehydration. Will get labs, UA, CXR. Will get orthostatic and hydrate patient. Since she fell will get CT head.   5:13 PM Hg 4.4. Occ positive, black stools. Started on protonix drip. Ordered 3 U PRBC. Also WBC 85 and was 40 recently. Consulted Dr.  Grayland Ormond, oncology, who recommend transfusion and treat GI bleed. He can see patient as per hospitalist request. He doesn't think she has AML currently or have side effect from [place].   Wandra Arthurs, MD 01/31/15 [phone]

## 2015-01-31 NOTE — ED Notes (Signed)
Pt arrived via EMS from home.  Pt states she just hasn't been feeling well. C/o lightheadedness and dizziness.  She has not been taking her medications for diabetes for 3 days because she is "forgetful" and "hard headed"  Denies any SOB. Lives with son.  She also said she has been drinking more regular sodas than water over the past few days.  Has not eaten anything today. Last meal was yesterday evening. Multiple falls recently.  Abrasion to right eye

## 2015-01-31 NOTE — H&P (Signed)
[facility] [place] NAME: Mikayla Keller    MR#:  [id number]  DATE OF BIRTH:  October 30, 1945  DATE OF ADMISSION:  01/31/2015  PRIMARY CARE PHYSICIAN: Elton Sin, DO   REQUESTING/REFERRING PHYSICIAN: Shirlyn Goltz  CHIEF COMPLAINT:   Chief Complaint  Patient presents with  . Hyperglycemia  . Dizziness    HISTORY OF PRESENT ILLNESS:  Mikayla Keller  is a 69 y.o. female with a known history of CML, hypertension, diabetes. She presents with dizziness and falls. She was recently diagnosed with CML and started on prednisone and gleevac. She states that she's been getting lightheaded. When she stands she would fall back down. She's had a couple falls recently and one which she scraped her head. Her blood pressure is been on the lower side and her sugar is been high. Today she felt a lot worse. She's been sleeping a lot. She does not see any blood in her bowel movements. In the ER, she was found to have a hemoglobin of 4.2 which is down from 8.6 one month ago. Also her platelet count dropped from 113,000 to 24,000. White count went up from 40,000 to 85,000. Patient was guaiac positive in the ER. Of note the patient was in ICU to her couple months ago.  PAST MEDICAL HISTORY:   Past Medical History  Diagnosis Date  . 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 Hypertension   . Diabetes mellitus without complication   . Shortness of breath dyspnea   . History of chemotherapy 2005  . CML (chronic myelocytic leukemia)   . CML (chronic myelocytic leukemia)     PAST SURGICAL HISTORY:   Past Surgical History  Procedure Laterality Date  . Abdominal hysterectomy  2005    endometrial cancer  . Ovary surgery Bilateral   . Colonoscopy  2012 ?    SOCIAL HISTORY:   Social History  Substance Use Topics  . Smoking status: Former Smoker    Quit date: 12/06/1958  . Smokeless tobacco: Never Used  . Alcohol Use: No    FAMILY HISTORY:    Family History  Problem Relation Age of Onset  . Cancer Sister     breast ? late 71's  . Cancer Sister      breast mid 85's  . Cancer Cousin     lung late 56's /paternal cousin    DRUG ALLERGIES:  No Known Allergies  REVIEW OF SYSTEMS:  CONSTITUTIONAL: No fever, positive for fatigue. Cold feeling. Weight loss than weight gain. EYES: No blurred or double vision.  EARS, NOSE, AND THROAT: No tinnitus or ear pain. No sore throat RESPIRATORY: Some cough, no shortness of breath, wheezing or hemoptysis.  CARDIOVASCULAR: Occasional chest pain, no orthopnea, edema.  GASTROINTESTINAL: Some nausea, no vomiting or abdominal pain. No blood in bowel movements that she sees. Positive for diarrhea GENITOURINARY: No dysuria, hematuria.  ENDOCRINE: No polyuria, nocturia,  HEMATOLOGY: History of anemia SKIN: No rash or lesion. MUSCULOSKELETAL: No joint pain or arthritis.   NEUROLOGIC: Positive for dizziness  PSYCHIATRY: No anxiety or depression.   MEDICATIONS AT HOME:   Prior to Admission medications   Medication Sig Start Date End Date Taking? Authorizing Provider  aspirin EC 81 MG tablet Take 81 mg by mouth daily.   Yes Historical Provider, MD  atenolol (TENORMIN) 50 MG tablet Take 50 mg by mouth daily.   Yes Historical Provider, MD  glipiZIDE (GLUCOTROL) 10 MG tablet Take 10 mg by  mouth 2 (two) times daily.   Yes Historical Provider, MD  imatinib (GLEEVEC) 400 MG tablet Take 1 tablet (400 mg total) by mouth daily. Take with meals and large glass of water.Caution:Chemotherapy. 12/12/14  Yes Evlyn Kanner, NP  lisinopril-hydrochlorothiazide (PRINZIDE,ZESTORETIC) 20-12.5 MG per tablet Take 1 tablet by mouth daily.   Yes Historical Provider, MD  metFORMIN (GLUCOPHAGE) 1000 MG tablet Take 1,000 mg by mouth 2 (two) times daily with a meal.   Yes Historical Provider, MD  pravastatin (PRAVACHOL) 40 MG tablet Take 40 mg by mouth at bedtime.    Yes Historical Provider, MD  predniSONE (DELTASONE) 5  MG tablet Take 1 tablet (5 mg total) by mouth daily with breakfast. Take for 30 days then stop. Patient taking differently: Take 5 mg by mouth daily with breakfast.  01/08/15  Yes Forest Gleason, MD  allopurinol (ZYLOPRIM) 100 MG tablet Take 2 tablets (200 mg total) by mouth daily. Patient not taking: Reported on 01/31/2015 12/25/14   Forest Gleason, MD      VITAL SIGNS:  Blood pressure 119/54, pulse 102, temperature 98.6 F (37 C), temperature source Oral, resp. rate 25, height 5\' 6"  (1.676 m), weight 66.225 kg (146 lb), SpO2 95 %.  PHYSICAL EXAMINATION:  GENERAL:  69 y.o.-year-old patient lying in the bed with no acute distress.  EYES: Pupils equal, round, reactive to light and accommodation. No scleral icterus. Extraocular muscles intact.  HEENT: Head atraumatic, normocephalic. Oropharynx and nasopharynx clear.  NECK:  Supple, no jugular venous distention. No thyroid enlargement, no tenderness.  LUNGS: Normal breath sounds bilaterally, no wheezing, rales,rhonchi or crepitation. No use of accessory muscles of respiration.  CARDIOVASCULAR: S1, S2 normal. 2/6 systolic murmurs, no rubs, or gallops.  ABDOMEN: Soft, nontender, nondistended. Bowel sounds present. No organomegaly or mass.  EXTREMITIES: No pedal edema, cyanosis, or clubbing.  NEUROLOGIC: Cranial nerves II through XII are intact. Muscle strength 5/5 in all extremities. Sensation intact. Gait not checked.  PSYCHIATRIC: The patient is alert and oriented x 3.  SKIN: No rash, lesion, or ulcer.   LABORATORY PANEL:   CBC  Recent Labs Lab 01/31/15 1359  WBC 85.2*  HGB 4.2*  HCT 13.8*  PLT 24*   ------------------------------------------------------------------------------------------------------------------  Chemistries   Recent Labs Lab 01/31/15 1359  NA 131*  K 4.5  CL 102  CO2 20*  GLUCOSE 158*  BUN 17  CREATININE 0.81  CALCIUM 8.3*  AST 14*  ALT 7*  ALKPHOS 56  BILITOT 1.8*    ------------------------------------------------------------------------------------------------------------------  Cardiac Enzymes  Recent Labs Lab 01/31/15 1359  TROPONINI 0.03   ------------------------------------------------------------------------------------------------------------------  RADIOLOGY:  Ct Head Wo Contrast  01/31/2015   CLINICAL DATA:  Dizziness.  EXAM: CT HEAD WITHOUT CONTRAST  TECHNIQUE: Contiguous axial images were obtained from the base of the skull through the vertex without intravenous contrast.  COMPARISON:  None.  FINDINGS: Bony calvarium appears intact. No mass effect or midline shift is noted. Ventricular size is within normal limits. There is no evidence of mass lesion, hemorrhage or acute infarction.  IMPRESSION: Normal head CT.   Electronically Signed   By: Marijo Conception, M.D.   On: 01/31/2015 16:08   Dg Abd Acute W/chest  01/31/2015   CLINICAL DATA:  Acute left lower quadrant abdominal pain.  EXAM: DG ABDOMEN ACUTE W/ 1V CHEST  COMPARISON:  CT scan of November 27, 2014.  FINDINGS: Stable cardiomediastinal silhouette. Mild bilateral pleural effusions are noted. No pneumoperitoneum is noted. Surgical clips are noted in the pelvis. Enlarged  spleen is noted on the left. There is no evidence of bowel obstruction or ileus.  IMPRESSION: Splenomegaly.  No evidence of bowel obstruction or ileus.   Electronically Signed   By: Marijo Conception, M.D.   On: 01/31/2015 15:44    EKG:   Sinus tachycardia 103 bpm  IMPRESSION AND PLAN:   1. 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 ER physician ordered transfusion of 3 units of packed red blood cells. I will order Lasix after each unit. I will stop aspirin. GI consultation tomorrow. Patient was placed on Protonix drip. I will admit to the CCU stepdown area because of hypotension and tachycardia. 2. Hypotension- hold blood pressure medications. 3. CML- drop in platelet count and elevated white count along with the  anemia. Oncology consultation tomorrow to look at the peripheral smear. 4. Diabetes mellitus type II- I will put on sliding scale at this time. 5. History of ovarian cancer 6. Hyponatremia- Will recheck in a.m. after blood transfusions. Patient did get some IV fluids here in the ER.  All the records are reviewed and case discussed with ED provider. Management plans discussed with the patient, family and they are in agreement.  CODE STATUS: Full code  TOTAL TIME TAKING CARE OF THIS PATIENT: 50 minutes, patient will be admitted to the CCU stepdown unit for closer monitoring of vital signs.    Loletha Grayer M.D on 01/31/2015 at 6:16 PM  Between 7am to 6pm - Pager - [phone]  After 6pm call admission pager [facility]  Office  [phone]  CC: Primary care physician; Elton Sin, DO

## 2015-02-01 DIAGNOSIS — C9210 Chronic myeloid leukemia, BCR/ABL-positive, not having achieved remission: Secondary | ICD-10-CM

## 2015-02-01 DIAGNOSIS — D649 Anemia, unspecified: Secondary | ICD-10-CM

## 2015-02-01 DIAGNOSIS — Z8543 Personal history of malignant neoplasm of ovary: Secondary | ICD-10-CM

## 2015-02-01 DIAGNOSIS — R531 Weakness: Secondary | ICD-10-CM

## 2015-02-01 DIAGNOSIS — R42 Dizziness and giddiness: Secondary | ICD-10-CM

## 2015-02-01 DIAGNOSIS — D696 Thrombocytopenia, unspecified: Secondary | ICD-10-CM

## 2015-02-01 DIAGNOSIS — Z9221 Personal history of antineoplastic chemotherapy: Secondary | ICD-10-CM

## 2015-02-01 LAB — BASIC METABOLIC PANEL
ANION GAP: 9 (ref 5–15)
BUN: 17 mg/dL (ref 6–20)
CHLORIDE: 105 mmol/L (ref 101–111)
CO2: 21 mmol/L — ABNORMAL LOW (ref 22–32)
Calcium: 8.3 mg/dL — ABNORMAL LOW (ref 8.9–10.3)
Creatinine, Ser: 0.83 mg/dL (ref 0.44–1.00)
GFR calc Af Amer: >60 mL/min (ref >60)
GLUCOSE: 172 mg/dL — AB (ref 65–99)
POTASSIUM: 3.5 mmol/L (ref 3.5–5.1)
SODIUM: 135 mmol/L (ref 135–145)

## 2015-02-01 LAB — CBC
HEMATOCRIT: 25.2 % — AB (ref 35.0–47.0)
HEMOGLOBIN: 8.7 g/dL — AB (ref 12.0–16.0)
MCH: 32.2 pg (ref 26.0–34.0)
MCHC: 34.4 g/dL (ref 32.0–36.0)
MCV: 93.7 fL (ref 80.0–100.0)
Platelets: 13 10*3/uL — CL (ref 150–440)
RBC: 2.69 MIL/uL — ABNORMAL LOW (ref 3.80–5.20)
RDW: 21.8 % — ABNORMAL HIGH (ref 11.5–14.5)
WBC: 77.6 10*3/uL — AB (ref 3.6–11.0)

## 2015-02-01 MED ORDER — PANTOPRAZOLE SODIUM 40 MG PO TBEC
40.0000 mg | DELAYED_RELEASE_TABLET | Freq: Two times a day (BID) | ORAL | Status: DC
  Administered 2015-02-01 – 2015-02-03 (×4): 40 mg via ORAL
  Filled 2015-02-01 (×4): qty 1

## 2015-02-01 MED ORDER — ACETAMINOPHEN 325 MG PO TABS
650.0000 mg | ORAL_TABLET | Freq: Once | ORAL | Status: AC
  Administered 2015-02-01: 650 mg via ORAL
  Filled 2015-02-01: qty 2

## 2015-02-01 MED ORDER — ENSURE ENLIVE PO LIQD
237.0000 mL | Freq: Two times a day (BID) | ORAL | Status: DC
  Administered 2015-02-01 – 2015-02-03 (×2): 237 mL via ORAL

## 2015-02-01 MED ORDER — SODIUM CHLORIDE 0.9 % IV SOLN
Freq: Once | INTRAVENOUS | Status: AC
  Administered 2015-02-01: 10:00:00 via INTRAVENOUS

## 2015-02-01 NOTE — Plan of Care (Signed)
Problem: Discharge Progression Outcomes Goal: Other Discharge Outcomes/Goals Outcome: Progressing Plan of care progress to goal for: 1. Discharge Plan:         Plan to return home after discharge. 2. Pain:         Denies pain. 3. Hemodynamically Stable:         VSS.         Afebrile.         Remains on Protonix Drip at 8mg /hr., 40ml/hr.         WBC's 77.6. Chronic Myelocytic Leukemia (CML). Oncologist Consulted.         Hgb 4.2. Received 3 Units PRBC's. Hgb 8.7.         Platelets 13. Received 1 Unit Platelets. Redraw in A.M.          4. Complications:         No signs/symptoms of complications noted. 5. Diet:         Full Liquid Diet. Tolerating Well. 6. Activity:         Bedrest.

## 2015-02-01 NOTE — Progress Notes (Signed)
[facility] [place] NAME: Mikayla Keller    MR#:  [id number]  DATE OF BIRTH:  1945/06/19  SUBJECTIVE:  CHIEF COMPLAINT:   Chief Complaint  Patient presents with  . Hyperglycemia  . Dizziness   feels better but is still weak.  REVIEW OF SYSTEMS:  CONSTITUTIONAL: No fever, generalized weakness.  EYES: No blurred or double vision.  EARS, NOSE, AND THROAT: No tinnitus or ear pain.  RESPIRATORY: No cough, shortness of breath, wheezing or hemoptysis.  CARDIOVASCULAR: No chest pain, orthopnea, edema.  GASTROINTESTINAL: No nausea, vomiting, diarrhea or abdominal pain. No melena or bloody stool. GENITOURINARY: No dysuria, hematuria.  ENDOCRINE: No polyuria, nocturia,  HEMATOLOGY: No anemia, easy bruising or bleeding SKIN: No rash or lesion. MUSCULOSKELETAL: No joint pain or arthritis.   NEUROLOGIC: No tingling, numbness, weakness.  PSYCHIATRY: No anxiety or depression.   DRUG ALLERGIES:  No Known Allergies  VITALS:  Blood pressure 116/69, pulse 87, temperature 97.7 F (36.5 C), temperature source Oral, resp. rate 19, height 5\' 6"  (1.676 m), weight 62.596 kg (138 lb), SpO2 100 %.  PHYSICAL EXAMINATION:  GENERAL:  69 y.o.-year-old patient lying in the bed with no acute distress.  EYES: Pupils equal, round, reactive to light and accommodation. No scleral icterus. Extraocular muscles intact.  HEENT: Head atraumatic, normocephalic. Oropharynx and nasopharynx clear.  NECK:  Supple, no jugular venous distention. No thyroid enlargement, no tenderness.  LUNGS: Normal breath sounds bilaterally, no wheezing, rales,rhonchi or crepitation. No use of accessory muscles of respiration.  CARDIOVASCULAR: S1, S2 normal. No murmurs, rubs, or gallops.  ABDOMEN: Soft, nontender, nondistended. Bowel sounds present. No organomegaly or mass.  EXTREMITIES: No pedal edema, cyanosis, or clubbing.  NEUROLOGIC: Cranial nerves II through XII are intact.  Muscle strength 3/5 in all extremities. Sensation intact. Gait not checked.  PSYCHIATRIC: The patient is alert and oriented x 3.  SKIN: No obvious rash, lesion, or ulcer.    LABORATORY PANEL:   CBC  Recent Labs Lab 02/01/15 0708  WBC 77.6*  HGB 8.7*  HCT 25.2*  PLT 13*   ------------------------------------------------------------------------------------------------------------------  Chemistries   Recent Labs Lab 01/31/15 1359 02/01/15 0708  NA 131* 135  K 4.5 3.5  CL 102 105  CO2 20* 21*  GLUCOSE 158* 172*  BUN 17 17  CREATININE 0.81 0.83  CALCIUM 8.3* 8.3*  AST 14*  --   ALT 7*  --   ALKPHOS 56  --   BILITOT 1.8*  --    ------------------------------------------------------------------------------------------------------------------  Cardiac Enzymes  Recent Labs Lab 01/31/15 1359  TROPONINI 0.03   ------------------------------------------------------------------------------------------------------------------  RADIOLOGY:  Ct Head Wo Contrast  01/31/2015   CLINICAL DATA:  Dizziness.  EXAM: CT HEAD WITHOUT CONTRAST  TECHNIQUE: Contiguous axial images were obtained from the base of the skull through the vertex without intravenous contrast.  COMPARISON:  None.  FINDINGS: Bony calvarium appears intact. No mass effect or midline shift is noted. Ventricular size is within normal limits. There is no evidence of mass lesion, hemorrhage or acute infarction.  IMPRESSION: Normal head CT.   Electronically Signed   By: Marijo Conception, M.D.   On: 01/31/2015 16:08   Dg Abd Acute W/chest  01/31/2015   CLINICAL DATA:  Acute left lower quadrant abdominal pain.  EXAM: DG ABDOMEN ACUTE W/ 1V CHEST  COMPARISON:  CT scan of November 27, 2014.  FINDINGS: Stable cardiomediastinal silhouette. Mild bilateral pleural effusions are noted. No pneumoperitoneum is noted. Surgical clips are  noted in the pelvis. Enlarged spleen is noted on the left. There is no evidence of bowel obstruction or  ileus.  IMPRESSION: Splenomegaly.  No evidence of bowel obstruction or ileus.   Electronically Signed   By: Marijo Conception, M.D.   On: 01/31/2015 15:44    EKG:   Orders placed or performed during the hospital encounter of 01/31/15  . EKG 12-Lead  . EKG 12-Lead  . EKG 12-Lead  . EKG 12-Lead    ASSESSMENT AND PLAN:   1. 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 S/p transfusion of 3 units of packed red blood cells. Hold aspirin. Check stool occult and follow-up GI consultation Discontinued Protonix drip. Change to by mouth Protonix.  2. Hypotension, blood pressure is better, hold blood pressure medications. 3. CML. platelet count decreased to 13,000. Per Dr. Grayland Ormond, hold Gleevac, platelet transfusion 1 unit today. Follow-up CBC tomorrow morning.   4. Diabetes mellitus type II.  on sliding scale at this time. 5. History of ovarian cancer 6. Hyponatremia. Improved   All the records are reviewed and case discussed with Care Management/Social Workerr. Management plans discussed with the patient, family and they are in agreement.  CODE STATUS: Full code  TOTAL TIME TAKING CARE OF THIS PATIENT: 40 minutes.   POSSIBLE D/C IN 2 DAYS, DEPENDING ON CLINICAL CONDITION.   Demetrios Loll M.D on 02/01/2015 at 3:26 PM  Between 7am to 6pm - Pager - [phone]  After 6pm go to [email or web address] - password [email or web address] [facility]  Office  [phone]  CC: Primary care physician; Elton Sin, DO

## 2015-02-01 NOTE — Consult Note (Signed)
[facility]  [phone] [phone]  ID: Gracy Racer OB: October 30, 1945  MR#: [id number]  ESP#:[id number]  Patient Care Team: Elton Sin, DO as PCP - General (Internal Medicine) Christene Lye, MD as Surgeon (General Surgery) Forest Gleason, MD (Oncology)  CHIEF COMPLAINT:  Chief Complaint  Patient presents with  . Hyperglycemia  . Dizziness    INTERVAL HISTORY: Patient is a 69 year old female with known history of ovarian cancer as well as a recent diagnosis of CML.  She recently started on [place] in August 2016 and had been tolerating her treatments well. Recently she became increasingly weak and fatigued as well as increased dizziness. Upon evaluation in the emergency room she was found to have a significantly decreased hemoglobin of 4.4 as well as a decreased platelet count.  She has received multiple units of packed red blood cells and feels improved and nearly back to her baseline. She denies any fevers. She has no neurologic complaints. She denies any chest pain or shortness of breath. She denies any nausea, vomiting, constipation, or diarrhea. Patient offers no further specific complaints.  REVIEW OF SYSTEMS:   Review of Systems  Constitutional: Positive for malaise/fatigue. Negative for fever.  Respiratory: Negative.   Cardiovascular: Negative.   Gastrointestinal: Positive for blood in stool.  Genitourinary: Negative.   Musculoskeletal: Negative.   Neurological: Positive for weakness.    As per HPI. Otherwise, a complete review of systems is negatve.  PAST MEDICAL HISTORY: Past Medical History  Diagnosis Date  . 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 Hypertension   . Diabetes mellitus without complication   . Shortness of breath dyspnea   . History of chemotherapy 2005  . CML (chronic myelocytic leukemia)   . CML (chronic myelocytic leukemia)     PAST SURGICAL HISTORY: Past Surgical History  Procedure  Laterality Date  . Abdominal hysterectomy  2005    endometrial cancer  . Ovary surgery Bilateral   . Colonoscopy  2012 ?    FAMILY HISTORY Family History  Problem Relation Age of Onset  . Cancer Sister     breast ? late 68's  . Cancer Sister      breast mid 37's  . Cancer Cousin     lung late 8's /paternal cousin       ADVANCED DIRECTIVES:    HEALTH MAINTENANCE: Social History  Substance Use Topics  . Smoking status: Former Smoker    Quit date: 12/06/1958  . Smokeless tobacco: Never Used  . Alcohol Use: No     Colonoscopy:  PAP:  Bone density:  Lipid panel:  No Known Allergies  Current Facility-Administered Medications  Medication Dose Route Frequency Provider Last Rate Last Dose  . acetaminophen (TYLENOL) tablet 650 mg  650 mg Oral Q6H PRN Loletha Grayer, MD       Or  . acetaminophen (TYLENOL) suppository 650 mg  650 mg Rectal Q6H PRN Loletha Grayer, MD      . feeding supplement (ENSURE ENLIVE) (ENSURE ENLIVE) liquid 237 mL  237 mL Oral BID BM Demetrios Loll, MD   237 mL at 02/01/15 1500  . pantoprazole (PROTONIX) EC tablet 40 mg  40 mg Oral BID AC Demetrios Loll, MD   40 mg at 02/01/15 1651  . pravastatin (PRAVACHOL) tablet 40 mg  40 mg Oral QHS Loletha Grayer, MD   40 mg at 02/01/15 0039  . predniSONE (DELTASONE) tablet 5 mg  5 mg Oral Q breakfast Loletha Grayer, MD  5 mg at 02/01/15 0725  . sodium chloride 0.9 % injection 3 mL  3 mL Intravenous Q12H Loletha Grayer, MD   3 mL at 01/31/15 2200    OBJECTIVE: Filed Vitals:   02/01/15 1430  BP: 116/69  Pulse: 87  Temp: 97.7 F (36.5 C)  Resp: 19     Body mass index is 22.28 kg/(m^2).    ECOG FS:1 - Symptomatic but completely ambulatory  General: Well-developed, well-nourished, no acute distress. Eyes: Pink conjunctiva, anicteric sclera. HEENT: Normocephalic, moist mucous membranes, clear oropharnyx. Poor dentition. Lungs: Clear to auscultation bilaterally. Heart: Regular rate and rhythm. No rubs, murmurs,  or gallops. Abdomen: Soft, nontender, nondistended. No organomegaly noted, normoactive bowel sounds. Musculoskeletal: No edema, cyanosis, or clubbing. Neuro: Alert, answering all questions appropriately. Cranial nerves grossly intact. Skin: No rashes or petechiae noted. Psych: Normal affect. Lymphatics: No cervical, calvicular, axillary or inguinal LAD.   LAB RESULTS:  Lab Results  Component Value Date   NA 135 02/01/2015   K 3.5 02/01/2015   CL 105 02/01/2015   CO2 21* 02/01/2015   GLUCOSE 172* 02/01/2015   BUN 17 02/01/2015   CREATININE 0.83 02/01/2015   CALCIUM 8.3* 02/01/2015   PROT 6.6 01/31/2015   ALBUMIN 3.0* 01/31/2015   AST 14* 01/31/2015   ALT 7* 01/31/2015   ALKPHOS 56 01/31/2015   BILITOT 1.8* 01/31/2015   GFRNONAA >60 02/01/2015   GFRAA >60 02/01/2015    Lab Results  Component Value Date   WBC 77.6* 02/01/2015   NEUTROABS 29.0* 01/31/2015   HGB 8.7* 02/01/2015   HCT 25.2* 02/01/2015   MCV 93.7 02/01/2015   PLT 13* 02/01/2015     STUDIES: Ct Head Wo Contrast  01/31/2015   CLINICAL DATA:  Dizziness.  EXAM: CT HEAD WITHOUT CONTRAST  TECHNIQUE: Contiguous axial images were obtained from the base of the skull through the vertex without intravenous contrast.  COMPARISON:  None.  FINDINGS: Bony calvarium appears intact. No mass effect or midline shift is noted. Ventricular size is within normal limits. There is no evidence of mass lesion, hemorrhage or acute infarction.  IMPRESSION: Normal head CT.   Electronically Signed   By: Marijo Conception, M.D.   On: 01/31/2015 16:08   Dg Abd Acute W/chest  01/31/2015   CLINICAL DATA:  Acute left lower quadrant abdominal pain.  EXAM: DG ABDOMEN ACUTE W/ 1V CHEST  COMPARISON:  CT scan of November 27, 2014.  FINDINGS: Stable cardiomediastinal silhouette. Mild bilateral pleural effusions are noted. No pneumoperitoneum is noted. Surgical clips are noted in the pelvis. Enlarged spleen is noted on the left. There is no evidence of  bowel obstruction or ileus.  IMPRESSION: Splenomegaly.  No evidence of bowel obstruction or ileus.   Electronically Signed   By: Marijo Conception, M.D.   On: 01/31/2015 15:44    ASSESSMENT: CML, now with significant anemia and thrombocytopenia.  PLAN:    1. CML: Recent diagnosis and patient started on [place] in August 2016. It is possible her severe anemia as well as thrombocytopenia are directly related to her Gleevec rather than progression of disease. Gleevec is on hold. No need to repeat bone marrow biopsy at this time, but would consider if no improvement in blood counts after discontinuation of Gleevec. Bone marrow suppression can occur with Gleevec in the first several months of treatment. 2. Anemia: Gleevec on hold as above. Patient also noted to have heme positive stools.  Iron stores are within normal limits. Her significantly  elevated ferritin level is likely an acute phase reactant. Patient has an increased total bilirubin, therefore will order LDH and haptoglobin to assess for any underlying hemolysis. 3. Thrombocytopenia: Possibly multifactorial with consumption from heme positive stools as well as Gleevec. Given normal creatinine, there is not a concern for TTP.  Appreciate consult, will follow.   Lloyd Huger, MD   02/01/2015 5:41 PM

## 2015-02-01 NOTE — Progress Notes (Signed)
Patient alert no complaints of pain. Room air, transfused platelets this afternoon. No active bleeding noted. Tolerating diet, has been incontinent throughout the day. Orders to be tx spoke with christa RN

## 2015-02-01 NOTE — Progress Notes (Signed)
Spoke with Dr Grayland Ormond regarding patients platelet count. Provider consulted will hold Gleevac

## 2015-02-01 NOTE — Progress Notes (Signed)
Initial Nutrition Assessment    INTERVENTION:   Meals and Snacks: Cater to patient preferences Medical Food Supplement Therapy: recommend Ensure Enlive po BID, each supplement provides 350 kcal and 20 grams of protein  NUTRITION DIAGNOSIS:   Inadequate oral intake related to acute illness as evidenced by  (liquid diet).  GOAL:   Patient will meet greater than or equal to 90% of their needs  MONITOR:    (Energy Intake, Digestive System, Anthropometrics, Electrolyte/Renal Profile)  REASON FOR ASSESSMENT:   Malnutrition Screening Tool    ASSESSMENT:    Pt admitted with weakness, dizziness and falls with chronic blood loss anemia  Past Medical History  Diagnosis Date  . 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 Hypertension   . Diabetes mellitus without complication   . Shortness of breath dyspnea   . History of chemotherapy 2005  . CML (chronic myelocytic leukemia)   . CML (chronic myelocytic leukemia)      Diet Order:  Diet full liquid Room service appropriate?: Yes; Fluid consistency:: Thin   Energy Intake: pt very hungry on visit this AM, wanting a cheeseburger. Pt not happy with current diet order, did request some apple juice  Food and Nutrition Related History: pt does report very good appetite prior to admission, eating well, drinking 1 Boost per day  Electrolyte and Renal Profile:  Recent Labs Lab 01/31/15 1359 02/01/15 0708  BUN 17 17  CREATININE 0.81 0.83  NA 131* 135  K 4.5 3.5   Glucose Profile:  Recent Labs  01/31/15 1954  GLUCAP 144*   Protein Profile:  Recent Labs Lab 01/31/15 1359  ALBUMIN 3.0*   Meds: lasix, prednisone  Height:   Ht Readings from Last 1 Encounters:  01/31/15 5\' 6"  (1.676 m)    Weight: pt reports wt has been stable  Wt Readings from Last 1 Encounters:  01/31/15 144 lb 10 oz (65.6 kg)   Wt Readings from Last 10 Encounters:  01/31/15 144 lb 10 oz (65.6 kg)  01/08/15 148 lb (67.132 kg)  12/18/14 143  lb (64.864 kg)  12/12/14 146 lb 8 oz (66.452 kg)  12/06/14 147 lb (66.679 kg)  11/25/14 146 lb 2.6 oz (66.3 kg)    BMI:  Body mass index is 23.35 kg/(m^2).  LOW Care Level  Kerman Passey MS, [place], LDN [phone] Pager

## 2015-02-01 NOTE — Progress Notes (Signed)
Physical Therapy Evaluation Patient Details Name: Mikayla Keller MRN: [id number] DOB: 04-18-1946 Today's Date: 02/01/2015   History of Present Illness  Patient is a 69 y.o. female admitted for chronic blood loss and anemia. Patient has received 3 units of blood since being admitted on 23 September. Patient has hx of HTN, Diabetes, and chronic myelocytic leukemia. Patient has hx of falls.  Clinical Impression  Patient is a pleasant 69 y.o. Female who is very motivated to get moving. Patient demonstrated slight dizziness at today's evaluation, and activity was limited within tolerance due to procedures that had occurred. While she is feeling better after receiving blood, she is not quite at her baseline level of functioning. Because she previously ambulated independently, it is believed that patient will benefit from continued PT services to improve gait and dynamic balance and to educate her on how to prevent falls in the future.     Follow Up Recommendations SNF    Equipment Recommendations  Rolling walker with 5" wheels    Recommendations for Other Services       Precautions / Restrictions Precautions Precautions: Fall Restrictions Weight Bearing Restrictions: No      Mobility  Bed Mobility Overal bed mobility: Independent                Transfers Overall transfer level: Needs assistance Equipment used: Rolling walker (2 wheeled) Transfers: Sit to/from Stand;Stand Pivot Transfers Sit to Stand: Modified independent (Device/Increase time) Stand pivot transfers: Min guard       General transfer comment: Patient experienced slight dizziness upon standing that subsided after a few seconds. Required verbal cues for proper use/sequencing of assistive device.  Ambulation/Gait                Stairs            Wheelchair Mobility    Modified Rankin (Stroke Patients Only)       Balance Overall balance assessment: Independent                                           Pertinent Vitals/Pain Pain Assessment: No/denies pain    Home Living Family/patient expects to be discharged to:: Private residence Living Arrangements: Alone Available Help at Discharge: Family;Available PRN/intermittently Type of Home: Mobile home Home Access: Stairs to enter Entrance Stairs-Rails: Can reach both Entrance Stairs-Number of Steps: 4 Home Layout: One level Home Equipment: None      Prior Function Level of Independence: Needs assistance   Gait / Transfers Assistance Needed: Patient ambulated independently, usually household but capable of community.  ADL's / Homemaking Assistance Needed: Niece provides most groceries, but patient is able to cook        Hand Dominance        Extremity/Trunk Assessment   Upper Extremity Assessment: Overall WFL for tasks assessed           Lower Extremity Assessment: Overall WFL for tasks assessed      Cervical / Trunk Assessment: Normal  Communication   Communication: No difficulties  Cognition Arousal/Alertness: Awake/alert Behavior During Therapy: WFL for tasks assessed/performed Overall Cognitive Status: Within Functional Limits for tasks assessed                      General Comments      Exercises        Assessment/Plan    PT Assessment Patient  needs continued PT services  PT Diagnosis Difficulty walking   PT Problem List Decreased activity tolerance;Decreased balance;Decreased mobility;Decreased knowledge of use of DME;Decreased safety awareness  PT Treatment Interventions DME instruction;Gait training;Stair training;Functional mobility training;Therapeutic activities;Therapeutic exercise;Balance training;Patient/family education   PT Goals (Current goals can be found in the Care Plan section) Acute Rehab PT Goals Patient Stated Goal: "To figure out where she is losing blood" PT Goal Formulation: With patient Time For Goal Achievement: 02/15/15 Potential  to Achieve Goals: Good    Frequency Min 2X/week   Barriers to discharge Inaccessible home environment;Decreased caregiver support      Co-evaluation               End of Session Equipment Utilized During Treatment: Gait belt Activity Tolerance: Patient tolerated treatment well;Other (comment) (Limited by dizziness) Patient left: in chair;with call bell/phone within reach;with nursing/sitter in room;with family/visitor present Nurse Communication: Mobility status         Time: 1620-1640 PT Time Calculation (min) (ACUTE ONLY): 20 min   Charges:   PT Evaluation $Initial PT Evaluation Tier I: 1 Procedure     PT G Codes:        Dorice Lamas, PT, DPT 02/01/2015, 4:48 PM

## 2015-02-02 LAB — TYPE AND SCREEN
ABO/RH(D): O POS
Antibody Screen: NEGATIVE
UNIT DIVISION: 0
UNIT DIVISION: 0
Unit division: 0
Unit division: 0
Unit division: 0

## 2015-02-02 LAB — CBC
HCT: 24 % — ABNORMAL LOW (ref 35.0–47.0)
Hemoglobin: 8.3 g/dL — ABNORMAL LOW (ref 12.0–16.0)
MCH: 33 pg (ref 26.0–34.0)
MCHC: 34.7 g/dL (ref 32.0–36.0)
MCV: 95.1 fL (ref 80.0–100.0)
Platelets: 33 K/uL — ABNORMAL LOW (ref 150–440)
RBC: 2.53 MIL/uL — ABNORMAL LOW (ref 3.80–5.20)
RDW: 24.8 % — ABNORMAL HIGH (ref 11.5–14.5)
WBC: 82.1 K/uL (ref 3.6–11.0)

## 2015-02-02 LAB — LACTATE DEHYDROGENASE: LDH: 350 U/L — ABNORMAL HIGH (ref 98–192)

## 2015-02-02 LAB — PREPARE PLATELET PHERESIS: UNIT DIVISION: 0

## 2015-02-02 NOTE — Progress Notes (Signed)
[facility] [place] NAME: Mikayla Keller    MR#:  [id number]  DATE OF BIRTH:  Aug 18, 1945  SUBJECTIVE:  CHIEF COMPLAINT:   Chief Complaint  Patient presents with  . Hyperglycemia  . Dizziness   feels better but is still weak.  REVIEW OF SYSTEMS:  CONSTITUTIONAL: No fever, generalized weakness.  EYES: No blurred or double vision.  EARS, NOSE, AND THROAT: No tinnitus or ear pain.  RESPIRATORY: No cough, shortness of breath, wheezing or hemoptysis.  CARDIOVASCULAR: No chest pain, orthopnea, edema.  GASTROINTESTINAL: No nausea, vomiting, diarrhea or abdominal pain. No melena or bloody stool. GENITOURINARY: No dysuria, hematuria.  ENDOCRINE: No polyuria, nocturia,  HEMATOLOGY: No anemia, easy bruising or bleeding SKIN: No rash or lesion. MUSCULOSKELETAL: No joint pain or arthritis.   NEUROLOGIC: No tingling, numbness, weakness.  PSYCHIATRY: No anxiety or depression.   DRUG ALLERGIES:  No Known Allergies  VITALS:  Blood pressure 128/63, pulse 101, temperature 98.3 F (36.8 C), temperature source Oral, resp. rate 18, height 5\' 6"  (1.676 m), weight 62.596 kg (138 lb), SpO2 95 %.  PHYSICAL EXAMINATION:  GENERAL:  69 y.o.-year-old patient lying in the bed with no acute distress.  EYES: Pupils equal, round, reactive to light and accommodation. No scleral icterus. Extraocular muscles intact.  HEENT: Head atraumatic, normocephalic. Oropharynx and nasopharynx clear.  NECK:  Supple, no jugular venous distention. No thyroid enlargement, no tenderness.  LUNGS: Normal breath sounds bilaterally, no wheezing, rales,rhonchi or crepitation. No use of accessory muscles of respiration.  CARDIOVASCULAR: S1, S2 normal. No murmurs, rubs, or gallops.  ABDOMEN: Soft, nontender, nondistended. Bowel sounds present. No organomegaly or mass.  EXTREMITIES: No pedal edema, cyanosis, or clubbing.  NEUROLOGIC: Cranial nerves II through XII are intact.  Muscle strength 3/5 in all extremities. Sensation intact. Gait not checked.  PSYCHIATRIC: The patient is alert and oriented x 3.  SKIN: No obvious rash, lesion, or ulcer.    LABORATORY PANEL:   CBC  Recent Labs Lab 02/02/15 0446  WBC 82.1*  HGB 8.3*  HCT 24.0*  PLT 33*   ------------------------------------------------------------------------------------------------------------------  Chemistries   Recent Labs Lab 01/31/15 1359 02/01/15 0708  NA 131* 135  K 4.5 3.5  CL 102 105  CO2 20* 21*  GLUCOSE 158* 172*  BUN 17 17  CREATININE 0.81 0.83  CALCIUM 8.3* 8.3*  AST 14*  --   ALT 7*  --   ALKPHOS 56  --   BILITOT 1.8*  --    ------------------------------------------------------------------------------------------------------------------  Cardiac Enzymes  Recent Labs Lab 01/31/15 1359  TROPONINI 0.03   ------------------------------------------------------------------------------------------------------------------  RADIOLOGY:  Ct Head Wo Contrast  01/31/2015   CLINICAL DATA:  Dizziness.  EXAM: CT HEAD WITHOUT CONTRAST  TECHNIQUE: Contiguous axial images were obtained from the base of the skull through the vertex without intravenous contrast.  COMPARISON:  None.  FINDINGS: Bony calvarium appears intact. No mass effect or midline shift is noted. Ventricular size is within normal limits. There is no evidence of mass lesion, hemorrhage or acute infarction.  IMPRESSION: Normal head CT.   Electronically Signed   By: Marijo Conception, M.D.   On: 01/31/2015 16:08   Dg Abd Acute W/chest  01/31/2015   CLINICAL DATA:  Acute left lower quadrant abdominal pain.  EXAM: DG ABDOMEN ACUTE W/ 1V CHEST  COMPARISON:  CT scan of November 27, 2014.  FINDINGS: Stable cardiomediastinal silhouette. Mild bilateral pleural effusions are noted. No pneumoperitoneum is noted. Surgical clips are  noted in the pelvis. Enlarged spleen is noted on the left. There is no evidence of bowel obstruction or  ileus.  IMPRESSION: Splenomegaly.  No evidence of bowel obstruction or ileus.   Electronically Signed   By: Marijo Conception, M.D.   On: 01/31/2015 15:44    EKG:   Orders placed or performed during the hospital encounter of 01/31/15  . EKG 12-Lead  . EKG 12-Lead  . EKG 12-Lead  . EKG 12-Lead    ASSESSMENT AND PLAN:   1. Severe anemia, likely related to her [place]. Hb is stable after transfusion of 3 units of packed red blood cells. Hold aspirin. positive stool occult. continue po Protonix.  2. Hypotension, blood pressure is better, hold blood pressure medications. 3. CML. platelet count increased from 13,000 to 33,000 after platelet transfusion 1 unit. Follow-up CBC tomorrow morning.   4. Diabetes mellitus type II.  on sliding scale at this time. 5. History of ovarian cancer 6. Hyponatremia. Improved  Weakness, per PT, need SNF. I discussed with SW.  All the records are reviewed and case discussed with Care Management/Social Workerr. Management plans discussed with the patient, family and they are in agreement.  CODE STATUS: Full code  TOTAL TIME TAKING CARE OF THIS PATIENT: 35 minutes.   POSSIBLE D/C IN 1-2 DAYS, DEPENDING ON CLINICAL CONDITION.   Demetrios Loll M.D on 02/02/2015 at 1:02 PM  Between 7am to 6pm - Pager - [phone]  After 6pm go to [email or web address] - password [email or web address] [facility]  Office  [phone]  CC: Primary care physician; Elton Sin, DO

## 2015-02-02 NOTE — Plan of Care (Signed)
Problem: Discharge Progression Outcomes Goal: Other Discharge Outcomes/Goals Outcome: Progressing Plan of care progress to goal:  No complaints of pain. Incontinent of urine. Up to [facility] for bowel movement. Possible discharge tomorrow to SNF - SW involved.

## 2015-02-02 NOTE — Clinical Social Work Note (Signed)
Clinical Social Work Assessment  Patient Details  Name: Mikayla Keller MRN: [id number] Date of Birth: 1945-10-18  Date of referral:  02/02/15               Reason for consult:  Facility Placement                Permission sought to share information with:  Facility Sport and exercise psychologist, Family Supports Permission granted to share information::  Yes, Verbal Permission Granted  Name::     Richard- Contact in the event pt cannot speak for herself.  Pt would prefer to speak to him about SNF placement.  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  No Need for family participation in patient care:  No (Coment)  Care giving concerns:  Current concern is that pt will refuse SNF placement.  But she has agreed to bed search.     Social Worker assessment / plan:  CSW spoke with pt.  She was very polite and pleasant during assessment.  She explained that she feels she fell because she didn't use a walker that a friend had loaned her.  She stated that her son usually helps her to move around the home, but she realizes that she needs more help at home.  Her son work 6 am to 4 pm and is unable to help her during the day.  She has not made a decision about going to SNF but she will allow CSW to conduct a bed search.  She would prefer to inform her family herself.  CSW will f/u later.  Pt also needs 3 night qualifying stay for her insurance to cover her SNF stay.  This will be met tonight if she meets medical criteria to stay another night tonight.    Employment status:  Retired,  Disabled (Comment on whether or not currently receiving Disability) Insurance information:  Medicare PT Recommendations:  [place] / Referral to community resources:  [place]  Patient/Family's Response to care:  Pt thanked CSW and agreed to bed search   Patient/Family's Understanding of and Emotional Response to Diagnosis, Current Treatment, and Prognosis:  Pt verbalized her understanding and is open to possibly going to SNF at DC.  Emotional Assessment Appearance:  Appears older than stated age Attitude/Demeanor/Rapport:   (Pleasent and polite and coorperative) Affect (typically observed):  Appropriate Orientation:  Oriented to Self, Oriented to Place, Oriented to  Time, Oriented to Situation Alcohol / Substance use:  Tobacco Use (former smoker) Psych involvement (Current and /or in the community):  No (Comment)  Discharge Needs  Concerns to be addressed:  Care Coordination Readmission within the last 30 days:  No Current discharge risk:  Physical Impairment Barriers to Discharge:  No Barriers Identified   Mathews Argyle, LCSW 02/02/2015, 9:46 AM

## 2015-02-02 NOTE — Progress Notes (Signed)
Pt temp of 99.2 prior to first blood transfusion when rechecked 83min after transfusion began temp 100.3.  Blood stopped elink informed and lab called.  Pt A&O, no SOB, no signs of rash and no complaints.  Informed by elink MD to continue blood as per protocol only to stop if temp change of 2 degrees or greater.

## 2015-02-02 NOTE — Consult Note (Signed)
GI Inpatient Consult Note Lollie Sails MD  Reason for Consult: Anemia and Hemoccult-positive stool   Attending Requesting Consult: Dr. Bridgett Larsson  Outpatient Primary Physician: Weston Brass, DO  History of Present Illness: Mikayla Keller is a 69 y.o. female admitted on 01/31/2015 with diagnosis of severe anemia, hypotension, CML. He has already been seen by college he was noted that her severe anemia could possibly also be related to having started [place] for treatment of CML. He is noted to have thrombocytopenia as well as leukopenia and anemia on admission.  She stated that she and feeling weak for several days prior to admission. There was some increasing fatigue over the course of Friday. His had some nausea for about a week but no emesis and blames this on having started the [place], complaining about a terrible taste with the medication. Her appetite was decreased. She has noted some recent weight loss. There is been no emesis. There is no heartburn or dysphagia. There is some occasional periumbilical discomfort. Bowel movements have been variable with diarrhea for about 2 weeks. She states that she did see some black bowel movements for about a week. She only takes 81 mg aspirin and denies the use of over-the-counter NSAIDs other aspirin products. He has never had a EGD or colonoscopy. He has no history of iron deficiency anemia and she has a normal MCV.  Past Medical History:  Past Medical History  Diagnosis Date  . 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 Hypertension   . Diabetes mellitus without complication   . Shortness of breath dyspnea   . History of chemotherapy 2005  . CML (chronic myelocytic leukemia)   . CML (chronic myelocytic leukemia)     Problem List: Patient Active Problem List   Diagnosis Date Noted  . Chronic blood loss anemia 01/31/2015  . Ovarian cancer 11/26/2014    Past Surgical History: Past Surgical History  Procedure Laterality Date   . Abdominal hysterectomy  2005    endometrial cancer  . Ovary surgery Bilateral   . Colonoscopy  2012 ?    Allergies: No Known Allergies  Home Medications: Prescriptions prior to admission  Medication Sig Dispense Refill Last Dose  . aspirin EC 81 MG tablet Take 81 mg by mouth daily.   unknown at unknown  . atenolol (TENORMIN) 50 MG tablet Take 50 mg by mouth daily.   unknown at unknown  . glipiZIDE (GLUCOTROL) 10 MG tablet Take 10 mg by mouth 2 (two) times daily.   unknown at unknown  . imatinib (GLEEVEC) 400 MG tablet Take 1 tablet (400 mg total) by mouth daily. Take with meals and large glass of water.Caution:Chemotherapy. 30 tablet 5 unknown at unknown  . lisinopril-hydrochlorothiazide (PRINZIDE,ZESTORETIC) 20-12.5 MG per tablet Take 1 tablet by mouth daily.   unknown at unknown  . metFORMIN (GLUCOPHAGE) 1000 MG tablet Take 1,000 mg by mouth 2 (two) times daily with a meal.   unknown at unknown  . pravastatin (PRAVACHOL) 40 MG tablet Take 40 mg by mouth at bedtime.    unknown at unknown  . predniSONE (DELTASONE) 5 MG tablet Take 1 tablet (5 mg total) by mouth daily with breakfast. Take for 30 days then stop. (Patient taking differently: Take 5 mg by mouth daily with breakfast. ) 30 tablet 0 unknown at unknown  . allopurinol (ZYLOPRIM) 100 MG tablet Take 2 tablets (200 mg total) by mouth daily. (Patient not taking: Reported on 01/31/2015) 30 tablet 0 Taking   Home medication  reconciliation was completed with the patient.   Scheduled Inpatient Medications:   . feeding supplement (ENSURE ENLIVE)  237 mL Oral BID BM  . pantoprazole  40 mg Oral BID AC  . pravastatin  40 mg Oral QHS  . predniSONE  5 mg Oral Q breakfast  . sodium chloride  3 mL Intravenous Q12H    Continuous Inpatient Infusions:     PRN Inpatient Medications:  acetaminophen **OR** acetaminophen  Family History: family history includes Cancer in her cousin, sister, and sister.   GI Family History: No apparent  family history of colorectal cancer. Social History:   reports that she quit smoking about 56 years ago. She has never used smokeless tobacco. She reports that she does not drink alcohol or use illicit drugs. ROS  Review of Systems: Review of systems per admission history and physical agree with same  Physical Examination: BP 128/63 mmHg  Pulse 101  Temp(Src) 98.3 F (36.8 C) (Oral)  Resp 18  Ht 5\' 6"  (1.676 m)  Wt 62.596 kg (138 lb)  BMI 22.28 kg/m2  SpO2 95% Gen: Elderly-appearing-year-old female no acute distress HEENT: Normocephalic atraumatic eyes are anicteric dentition is poor Neck: No JVD there is a mass in the right side of the neck proximally four by 8 cm.  Chest: Clear to auscultation CV: Regular rate and rhythm  Abd: Soft nontender nondistended bowel sounds positive and normoactive. There are no masses or organomegaly. MSX:JDBZ: Other:  Data: Lab Results  Component Value Date   WBC 82.1* 02/02/2015   HGB 8.3* 02/02/2015   HCT 24.0* 02/02/2015   MCV 95.1 02/02/2015   PLT 33* 02/02/2015    Recent Labs Lab 01/31/15 1359 02/01/15 0708 02/02/15 0446  HGB 4.2* 8.7* 8.3*   Lab Results  Component Value Date   NA 135 02/01/2015   K 3.5 02/01/2015   CL 105 02/01/2015   CO2 21* 02/01/2015   BUN 17 02/01/2015   CREATININE 0.83 02/01/2015   Lab Results  Component Value Date   ALT 7* 01/31/2015   AST 14* 01/31/2015   ALKPHOS 56 01/31/2015   BILITOT 1.8* 01/31/2015   No results for input(s): APTT, INR, PTT in the last 168 hours. CBC Latest Ref Rng 02/02/2015 02/01/2015 01/31/2015  WBC 3.6 - 11.0 K/uL 82.1(HH) 77.6(HH) 85.2(HH)  Hemoglobin 12.0 - 16.0 g/dL 8.3(L) 8.7(L) 4.2(LL)  Hematocrit 35.0 - 47.0 % 24.0(L) 25.2(L) 13.8(LL)  Platelets 150 - 440 K/uL 33(L) 13(LL) 24(LL)    STUDIES: No results found. @IMAGES @  Assessment: 1. Anemia. Multifactorial in the setting of his institution of a new chemotherapeutic agent as well as occult positive stool. Also  possible bone marrow affect patient in regard to recent diagnosis of CML. Patient currently is thrombocytopenic and she colonoscopy are relatively contraindicated under this situation.  Recommendations: 1. Agree with the need for EGD and colonoscopy when clinically feasible, as platelet count recurrence to normal. In the interim will check H. pylori serology and continue PPI 2. Following  Thank you for the consult. Please call with questions or concerns.  Lollie Sails, MD  02/02/2015 9:01 PM

## 2015-02-02 NOTE — Clinical Social Work Placement (Signed)
   CLINICAL SOCIAL WORK PLACEMENT  NOTE  Date:  02/02/2015  Patient Details  Name: Mikayla Keller MRN: [id number] Date of Birth: May 29, 1945  Clinical Social Work is seeking post-discharge placement for this patient at the [place] level of care (*CSW will initial, date and re-position this form in  chart as items are completed):  Yes   Patient/family provided with [facility] Work Department's list of facilities offering this level of care within the geographic area requested by the patient (or if unable, by the patient's family).  Yes   Patient/family informed of their freedom to choose among providers that offer the needed level of care, that participate in Medicare, Medicaid or managed care program needed by the patient, have an available bed and are willing to accept the patient.  Yes   Patient/family informed of [facility]'s ownership interest in [facility] and [facility], as well as of the fact that they are under no obligation to receive care at these facilities.  PASRR submitted to EDS on 02/02/15     PASRR number received on 02/02/15     Existing PASRR number confirmed on       FL2 transmitted to all facilities in geographic area requested by pt/family on 02/02/15     FL2 transmitted to all facilities within larger geographic area on       Patient informed that his/her managed care company has contracts with or will negotiate with certain facilities, including the following:            Patient/family informed of bed offers received.  Patient chooses bed at       Physician recommends and patient chooses bed at      Patient to be transferred to   on  .  Patient to be transferred to facility by       Patient family notified on   of transfer.  Name of family member notified:        PHYSICIAN Please sign FL2     Additional Comment:   CSW will f/u with offers once they have been received.    _______________________________________________ Mathews Argyle, LCSW 02/02/2015, 10:22 AM

## 2015-02-02 NOTE — Plan of Care (Signed)
Problem: Discharge Progression Outcomes Goal: Other Discharge Outcomes/Goals Outcome: Progressing Plan of Care Progress to Goal:   Pt denies pain. Pt hemoglobin has dropped to 8.3 from 8.7, WBC elevated from 77.6 to 82.1. Pt platelets has improved from 13 to 33. Pt has been resting comfortably during shift. No other signs of distress noted. Will continue to monitor.

## 2015-02-03 DIAGNOSIS — L8990 Pressure ulcer of unspecified site, unspecified stage: Secondary | ICD-10-CM | POA: Insufficient documentation

## 2015-02-03 NOTE — Progress Notes (Signed)
Report called to [place] at [facility]. EMS called for transportation. Awaiting transport. Madlyn Frankel, RN

## 2015-02-03 NOTE — Plan of Care (Signed)
Problem: Discharge Progression Outcomes Goal: Other Discharge Outcomes/Goals Plan of care progress to goal: - No complaints of pain. - Incontinent at times. - VSS. Possible d/c to SNF today.

## 2015-02-03 NOTE — Discharge Instructions (Signed)
Heart healthy and ADA diet. °Activity as tolerated. °

## 2015-02-03 NOTE — Care Management Important Message (Signed)
Important Message  Patient Details  Name: Mikayla Keller MRN: [id number] Date of Birth: [birth year]   Medicare Important Message Given:  Yes-second notification given    Darius Bump Allmond 02/03/2015, 9:57 AM

## 2015-02-03 NOTE — Progress Notes (Signed)
CSW informed by MD that patient is medically ready to discharge to SNF.  CSW updated FL2 and faxed copy of DC summary to [facility].  Discharge packet complete and placed on chart.    Patient is in agreement with discharging to SNF.  Per patient's request CSW called patient niece Maudie Mercury.  Spoke with nephew Kris Hartmann who will inform patient's son.     RN will call report and EMS will transport patient to [facility].     Casimer Lanius. Latanya Presser, MSW Clinical Social Work Department Emergency Room [phone] 1:40 PM

## 2015-02-03 NOTE — Discharge Summary (Signed)
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 Hypertension   . Diabetes mellitus without complication   . Shortness of breath dyspnea   . History of chemotherapy 2005  . CML (chronic myelocytic leukemia)   . CML (chronic myelocytic leukemia)     HOSPITAL COURSE:   1. Severe anemia, likely related to her [place]. Hb is stable after transfusion of 3 units of packed red blood cells. Hold aspirin due to positive stool occult. continue po Protonix. No endoscope at this time per Dr. Gustavo Lah.  2. Hypotension, blood pressure is better, resume blood pressure medications after discharge. 3. CML with thrombocytopenia. platelet count increased from 13,000 to 33,000 after platelet transfusion 1 unit. Follow-up Dr. Grayland Ormond as outpatient.  4. Diabetes mellitus type II. controlled. 5. History of ovarian cancer 6. Hyponatremia. Improved   DISCHARGE CONDITIONS:   Stable, discharge to SNF today.  CONSULTS OBTAINED:  Treatment Team:  Lloyd Huger, MD Lollie Sails, MD  DRUG ALLERGIES:  No Known Allergies  DISCHARGE MEDICATIONS:   Current Discharge Medication List    CONTINUE these medications which have NOT CHANGED   Details  atenolol (TENORMIN) 50 MG tablet Take 50 mg by mouth daily.    glipiZIDE (GLUCOTROL) 10 MG tablet Take 10 mg by mouth 2 (two) times daily.     lisinopril-hydrochlorothiazide (PRINZIDE,ZESTORETIC) 20-12.5 MG per tablet Take 1 tablet by mouth daily.    metFORMIN (GLUCOPHAGE) 1000 MG tablet Take 1,000 mg by mouth 2 (two) times daily with a meal.    pravastatin (PRAVACHOL) 40 MG tablet Take 40 mg by mouth at bedtime.    Associated Diagnoses: Ovarian cancer, unspecified laterality; Loss of weight    predniSONE (DELTASONE) 5 MG tablet Take 1 tablet (5 mg total) by mouth daily with breakfast. Take for 30 days then stop. Qty: 30 tablet, Refills: 0   Associated Diagnoses: CML (chronic myeloid leukemia)    allopurinol (ZYLOPRIM) 100 MG tablet Take 2 tablets (200 mg total) by mouth daily. Qty: 30 tablet, Refills: 0      STOP taking these medications     aspirin EC 81 MG tablet      imatinib (GLEEVEC) 400 MG tablet          DISCHARGE INSTRUCTIONS:    If you experience worsening of your admission symptoms, develop shortness of breath, life threatening emergency, suicidal or homicidal thoughts you must seek medical attention immediately by calling 911 or calling your MD immediately  if symptoms less severe.  You Must read complete instructions/literature along with all the possible adverse reactions/side effects for all the Medicines you take and that have been prescribed to you. Take any new Medicines after you have completely understood and accept all the possible adverse reactions/side effects.   Please note  You were cared for by a hospitalist during your hospital stay. If you have any questions about your discharge medications or the care you received while you were in  the hospital after you are discharged, you can call the unit and asked to speak with the hospitalist on call if the hospitalist that took care of you is not available. Once you are discharged, your primary care physician will handle any further medical issues. Please note that NO REFILLS for any discharge medications will be authorized once you are discharged, as  it is imperative that you return to your primary care physician (or establish a relationship with a primary care physician if you do not have one) for your aftercare needs so that they can reassess your need for medications and monitor your lab values.    Today   SUBJECTIVE   weakness   VITAL SIGNS:  Blood pressure 128/69, pulse 108, temperature 99.2 F (37.3 C), temperature source Oral, resp. rate 16, height 5\' 6"  (1.676 m), weight 62.596 kg (138 lb), SpO2 99 %.  I/O:   Intake/Output Summary (Last 24 hours) at 02/03/15 1151 Last data filed at 02/03/15 0800  Gross per 24 hour  Intake    720 ml  Output      0 ml  Net    720 ml    PHYSICAL EXAMINATION:  GENERAL:  69 y.o.-year-old patient lying in the bed with no acute distress.  EYES: Pupils equal, round, reactive to light and accommodation. No scleral icterus. Extraocular muscles intact.  HEENT: Head atraumatic, normocephalic. Oropharynx and nasopharynx clear. Moist oral mucosa. NECK:  Supple, no jugular venous distention. No thyroid enlargement, no tenderness.  LUNGS: Normal breath sounds bilaterally, no wheezing, rales,rhonchi or crepitation. No use of accessory muscles of respiration.  CARDIOVASCULAR: S1, S2 normal. No murmurs, rubs, or gallops.  ABDOMEN: Soft, non-tender, non-distended. Bowel sounds present. No organomegaly or mass.  EXTREMITIES: No pedal edema, cyanosis, or clubbing.  NEUROLOGIC: Cranial nerves II through XII are intact. Muscle strength 5/5 in all extremities. Sensation intact. Gait not checked.  PSYCHIATRIC: The patient is alert and oriented x 3.  SKIN: No obvious rash, lesion, or ulcer.   DATA REVIEW:   CBC  Recent Labs Lab 02/02/15 0446  WBC 82.1*  HGB 8.3*  HCT 24.0*  PLT 33*    Chemistries   Recent Labs Lab 01/31/15 1359 02/01/15 0708  NA 131* 135  K 4.5 3.5  CL 102 105  CO2 20* 21*  GLUCOSE 158* 172*  BUN 17 17  CREATININE 0.81 0.83  CALCIUM 8.3* 8.3*  AST 14*  --   ALT 7*   --   ALKPHOS 56  --   BILITOT 1.8*  --     Cardiac Enzymes  Recent Labs Lab 01/31/15 1359  TROPONINI 0.03    Microbiology Results  Results for orders placed or performed during the hospital encounter of 01/31/15  MRSA PCR Screening     Status: None   Collection Time: 01/31/15  7:56 PM  Result Value Ref Range Status   MRSA by PCR NEGATIVE NEGATIVE Final    Comment:        The GeneXpert MRSA Assay (FDA approved for NASAL specimens only), is one component of a comprehensive MRSA colonization surveillance program. It is not intended to diagnose MRSA infection nor to guide or monitor treatment for MRSA infections.     RADIOLOGY:  No results found.      Management plans discussed with the patient, family and they are in agreement.  CODE STATUS:     Code Status Orders        Start     Ordered  01/31/15 1812  Full code   Continuous     01/31/15 1812      TOTAL TIME TAKING CARE OF THIS PATIENT: 37 minutes.    Demetrios Loll M.D on 02/03/2015 at 11:51 AM  Between 7am to 6pm - Pager - [phone]  After 6pm go to [email or web address] - password [email or web address] [facility]  Office  [phone]  CC: Primary care physician; Elton Sin, DO

## 2015-02-03 NOTE — Progress Notes (Signed)
Patient discharged to [facility], transport provided by EMS. Madlyn Frankel, RN

## 2015-02-03 NOTE — Clinical Social Work Placement (Signed)
   CLINICAL SOCIAL WORK PLACEMENT  NOTE  Date:  02/03/2015  Patient Details  Name: Mikayla Keller MRN: [id number] Date of Birth: 1946/04/19  Clinical Social Work is seeking post-discharge placement for this patient at the [place] level of care (*CSW will initial, date and re-position this form in  chart as items are completed):  Yes   Patient/family provided with [place] Work Department's list of facilities offering this level of care within the geographic area requested by the patient (or if unable, by the patient's family).  Yes   Patient/family informed of their freedom to choose among providers that offer the needed level of care, that participate in Medicare, Medicaid or managed care program needed by the patient, have an available bed and are willing to accept the patient.  Yes   Patient/family informed of [facility]'s ownership interest in [facility] and [facility], as well as of the fact that they are under no obligation to receive care at these facilities.  PASRR submitted to EDS on 02/02/15     PASRR number received on 02/02/15     Existing PASRR number confirmed on       FL2 transmitted to all facilities in geographic area requested by pt/family on 02/02/15     FL2 transmitted to all facilities within larger geographic area on       Patient informed that his/her managed care company has contracts with or will negotiate with certain facilities, including the following:        Yes   Patient/family informed of bed offers received.  Patient chooses bed at [facility]     Physician recommends and patient chooses bed at      Patient to be transferred to [facility] on 02/03/15.  Patient to be transferred to facility by EMS     Patient family notified on 02/03/15 of transfer.  Name of family member notified:  Lissa Merlin was notified stated will inform son when he get off of work and niece Kim      PHYSICIAN Please sign FL2     Additional Comment:    _______________________________________________ Maurine Cane, LCSW 02/03/2015, 1:31 PM Casimer Lanius. Latanya Presser, MSW Clinical Social Work Department Emergency Room [phone] 1:31 PM

## 2015-02-04 LAB — HAPTOGLOBIN: Haptoglobin: 143 mg/dL (ref 34–200)

## 2015-02-05 ENCOUNTER — Other Ambulatory Visit: Payer: Medicare Other

## 2015-02-05 ENCOUNTER — Ambulatory Visit: Payer: Medicare Other | Admitting: Oncology

## 2015-02-10 ENCOUNTER — Inpatient Hospital Stay: Payer: Medicare Other | Attending: Oncology

## 2015-02-10 ENCOUNTER — Inpatient Hospital Stay: Payer: Medicare Other | Admitting: Oncology

## 2015-02-13 ENCOUNTER — Observation Stay
Admission: EM | Admit: 2015-02-13 | Discharge: 2015-02-14 | Disposition: A | Discharge status: Home / Self Care | Payer: Medicare Other | Attending: Internal Medicine | Admitting: Internal Medicine

## 2015-02-13 ENCOUNTER — Encounter: Payer: Self-pay | Admitting: Emergency Medicine

## 2015-02-13 DIAGNOSIS — Z7952 Long term (current) use of systemic steroids: Secondary | ICD-10-CM | POA: Diagnosis not present

## 2015-02-13 DIAGNOSIS — Z801 Family history of malignant neoplasm of trachea, bronchus and lung: Secondary | ICD-10-CM | POA: Diagnosis not present

## 2015-02-13 DIAGNOSIS — R5383 Other fatigue: Secondary | ICD-10-CM | POA: Diagnosis not present

## 2015-02-13 DIAGNOSIS — Z803 Family history of malignant neoplasm of breast: Secondary | ICD-10-CM | POA: Insufficient documentation

## 2015-02-13 DIAGNOSIS — Z7984 Long term (current) use of oral hypoglycemic drugs: Secondary | ICD-10-CM | POA: Diagnosis not present

## 2015-02-13 DIAGNOSIS — D696 Thrombocytopenia, unspecified: Secondary | ICD-10-CM | POA: Diagnosis not present

## 2015-02-13 DIAGNOSIS — C9210 Chronic myeloid leukemia, BCR/ABL-positive, not having achieved remission: Secondary | ICD-10-CM

## 2015-02-13 DIAGNOSIS — D6481 Anemia due to antineoplastic chemotherapy: Secondary | ICD-10-CM

## 2015-02-13 DIAGNOSIS — Z8543 Personal history of malignant neoplasm of ovary: Secondary | ICD-10-CM | POA: Diagnosis not present

## 2015-02-13 DIAGNOSIS — Z90710 Acquired absence of both cervix and uterus: Secondary | ICD-10-CM | POA: Insufficient documentation

## 2015-02-13 DIAGNOSIS — I10 Essential (primary) hypertension: Secondary | ICD-10-CM | POA: Insufficient documentation

## 2015-02-13 DIAGNOSIS — D619 Aplastic anemia, unspecified: Principal | ICD-10-CM | POA: Insufficient documentation

## 2015-02-13 DIAGNOSIS — Z79899 Other long term (current) drug therapy: Secondary | ICD-10-CM | POA: Insufficient documentation

## 2015-02-13 DIAGNOSIS — D6189 Other specified aplastic anemias and other bone marrow failure syndromes: Secondary | ICD-10-CM | POA: Diagnosis present

## 2015-02-13 DIAGNOSIS — Z8542 Personal history of malignant neoplasm of other parts of uterus: Secondary | ICD-10-CM | POA: Insufficient documentation

## 2015-02-13 DIAGNOSIS — E119 Type 2 diabetes mellitus without complications: Secondary | ICD-10-CM | POA: Diagnosis not present

## 2015-02-13 DIAGNOSIS — Z515 Encounter for palliative care: Secondary | ICD-10-CM | POA: Diagnosis not present

## 2015-02-13 DIAGNOSIS — Z9889 Other specified postprocedural states: Secondary | ICD-10-CM | POA: Diagnosis not present

## 2015-02-13 DIAGNOSIS — D649 Anemia, unspecified: Secondary | ICD-10-CM | POA: Diagnosis present

## 2015-02-13 DIAGNOSIS — D6959 Other secondary thrombocytopenia: Secondary | ICD-10-CM

## 2015-02-13 DIAGNOSIS — Z87891 Personal history of nicotine dependence: Secondary | ICD-10-CM | POA: Diagnosis not present

## 2015-02-13 LAB — CBC WITH DIFFERENTIAL/PLATELET
Band Neutrophils: 8 %
Basophils Absolute: 7.9 10*3/uL — ABNORMAL HIGH (ref 0–0.1)
Basophils Relative: 10 %
Blasts: 2 %
EOS PCT: 26 %
Eosinophils Absolute: 20.6 10*3/uL — ABNORMAL HIGH (ref 0–0.7)
HEMATOCRIT: 18.1 % — AB (ref 35.0–47.0)
Hemoglobin: 6 g/dL — ABNORMAL LOW (ref 12.0–16.0)
LYMPHS ABS: 10.3 10*3/uL — AB (ref 1.0–3.6)
Lymphocytes Relative: 13 %
MCH: 33.4 pg (ref 26.0–34.0)
MCHC: 33.4 g/dL (ref 32.0–36.0)
MCV: 99.8 fL (ref 80.0–100.0)
MYELOCYTES: 7 %
Metamyelocytes Relative: 3 %
Monocytes Absolute: 4 10*3/uL — ABNORMAL HIGH (ref 0.2–0.9)
Monocytes Relative: 5 %
NEUTROS PCT: 18 %
NRBC: 3 /100{WBCs} — AB
Neutro Abs: 34.8 10*3/uL — ABNORMAL HIGH (ref 1.4–6.5)
Other: 0 %
PROMYELOCYTES ABS: 8 %
Platelets: 13 10*3/uL — CL (ref 150–440)
RBC: 1.81 MIL/uL — AB (ref 3.80–5.20)
RDW: 25.6 % — ABNORMAL HIGH (ref 11.5–14.5)
SMEAR REVIEW: DECREASED
WBC: 79.1 10*3/uL — AB (ref 3.6–11.0)

## 2015-02-13 LAB — PROTIME-INR
INR: 1.41
Prothrombin Time: 17.5 seconds — ABNORMAL HIGH (ref 11.4–15.0)

## 2015-02-13 LAB — BASIC METABOLIC PANEL
ANION GAP: 8 (ref 5–15)
BUN: 36 mg/dL — ABNORMAL HIGH (ref 6–20)
CHLORIDE: 103 mmol/L (ref 101–111)
CO2: 21 mmol/L — ABNORMAL LOW (ref 22–32)
CREATININE: 0.79 mg/dL (ref 0.44–1.00)
Calcium: 8 mg/dL — ABNORMAL LOW (ref 8.9–10.3)
GFR calc non Af Amer: >60 mL/min (ref >60)
Glucose, Bld: 143 mg/dL — ABNORMAL HIGH (ref 65–99)
Potassium: 4.6 mmol/L (ref 3.5–5.1)
SODIUM: 132 mmol/L — AB (ref 135–145)

## 2015-02-13 MED ORDER — MORPHINE SULFATE (PF) 2 MG/ML IV SOLN: 2.0000 mg | INTRAVENOUS | Status: DC | PRN

## 2015-02-13 MED ORDER — ONDANSETRON HCL 4 MG/2ML IJ SOLN: 4.0000 mg | Freq: Four times a day (QID) | INTRAMUSCULAR | Status: DC | PRN

## 2015-02-13 MED ORDER — ONDANSETRON HCL 4 MG PO TABS: 4.0000 mg | ORAL_TABLET | Freq: Four times a day (QID) | ORAL | Status: DC | PRN

## 2015-02-13 MED ORDER — INFLUENZA VAC SPLIT QUAD 0.5 ML IM SUSY: 0.5000 mL | PREFILLED_SYRINGE | INTRAMUSCULAR | Status: DC

## 2015-02-13 MED ORDER — ACETAMINOPHEN 650 MG RE SUPP
650.0000 mg | Freq: Four times a day (QID) | RECTAL | Status: DC | PRN
Start: 2015-02-13 — End: 2015-02-14

## 2015-02-13 MED ORDER — OXYCODONE HCL 5 MG PO TABS
5.0000 mg | ORAL_TABLET | ORAL | Status: DC | PRN
  Administered 2015-02-14: 5 mg via ORAL

## 2015-02-13 MED ORDER — SODIUM CHLORIDE 0.9 % IV SOLN
10.0000 mL/h | Freq: Once | INTRAVENOUS | Status: AC
  Administered 2015-02-14: 10 mL/h via INTRAVENOUS

## 2015-02-13 MED ORDER — ACETAMINOPHEN 325 MG PO TABS: 650.0000 mg | ORAL_TABLET | Freq: Four times a day (QID) | ORAL | Status: DC | PRN

## 2015-02-13 NOTE — H&P (Signed)
[facility] at [place] NAME: Mikayla Keller    MR#:  [id number]  DATE OF BIRTH:  [birth year]   DATE OF ADMISSION:  02/13/2015  PRIMARY CARE PHYSICIAN: Elton Sin, DO  Oncologist: Jeb Levering  REQUESTING/REFERRING PHYSICIAN: Mc[place]  CHIEF COMPLAINT:   Chief Complaint  Patient presents with  . Fatigue    sent in for low h&h and low platelet    HISTORY OF PRESENT ILLNESS:  Mikayla Keller  is a 69 y.o. female with a known history of CML presenting with progressive fatigue. She did describes progressively worsening fatigue for unknown duration with associated dyspnea on exertion. She denies any chest pain, lightheadedness, abdominal pain, change in bowel habits or known bleeding. She states that she felt chilled today but no other symptoms. Noted to be anemic on routine workup. Emergency department course: Ordered for 2 unit packed red blood cell transfusion   PAST MEDICAL HISTORY:   Past Medical History  Diagnosis Date  . Ovarian cancer ([place]) 2005    endometrial cacner  . Hypertension   . Diabetes mellitus without complication ([place])   . Shortness of breath dyspnea   . History of chemotherapy 2005  . CML (chronic myelocytic leukemia) ([place])   . CML (chronic myelocytic leukemia) ([place])     PAST SURGICAL HISTORY:   Past Surgical History  Procedure Laterality Date  . Abdominal hysterectomy  2005    endometrial cancer  . Ovary surgery Bilateral   . Colonoscopy  2012 ?    SOCIAL HISTORY:   Social History  Substance Use Topics  . Smoking status: Former Smoker    Quit date: 12/06/1958  . Smokeless tobacco: Never Used  . Alcohol Use: No    FAMILY HISTORY:   Family History  Problem Relation Age of Onset  . Cancer Sister     breast ? late 54's  . Cancer Sister      breast mid 51's  . Cancer Cousin     lung late 43's /paternal cousin    DRUG ALLERGIES:  No Known Allergies  REVIEW OF SYSTEMS:  REVIEW OF  SYSTEMS:  CONSTITUTIONAL: Denies fevers, positive chills, fatigue, weakness.  EYES: Denies blurred vision, double vision, or eye pain.  EARS, NOSE, THROAT: Denies tinnitus, ear pain, hearing loss.  RESPIRATORY: denies cough, shortness of breath, wheezing  CARDIOVASCULAR: Denies chest pain, palpitations, edema. Positive dyspnea on exertion GASTROINTESTINAL: Denies nausea, vomiting, diarrhea, abdominal pain.  GENITOURINARY: Denies dysuria, hematuria.  ENDOCRINE: Denies nocturia or thyroid problems. HEMATOLOGIC AND LYMPHATIC: Denies easy bruising or bleeding.  SKIN: Denies rash or lesions.  MUSCULOSKELETAL: Denies pain in neck, back, shoulder, knees, hips, or further arthritic symptoms.  NEUROLOGIC: Denies paralysis, paresthesias.  PSYCHIATRIC: Denies anxiety or depressive symptoms. Otherwise full review of systems performed by me is negative.   MEDICATIONS AT HOME:   Prior to Admission medications   Medication Sig Start Date End Date Taking? Authorizing Provider  acetaminophen (TYLENOL) 325 MG tablet Take 650 mg by mouth every 4 (four) hours as needed for mild pain or moderate pain.   Yes Historical Provider, MD  allopurinol (ZYLOPRIM) 100 MG tablet Take 2 tablets (200 mg total) by mouth daily. 12/25/14  Yes Forest Gleason, MD  atenolol (TENORMIN) 50 MG tablet Take 50 mg by mouth daily.   Yes Historical Provider, MD  glipiZIDE (GLUCOTROL) 10 MG tablet Take 10 mg by mouth 2 (two) times daily.   Yes Historical Provider, MD  lisinopril-hydrochlorothiazide (PRINZIDE,ZESTORETIC) 20-12.5 MG per  tablet Take 1 tablet by mouth daily.   Yes Historical Provider, MD  metFORMIN (GLUCOPHAGE) 1000 MG tablet Take 1,000 mg by mouth 2 (two) times daily.    Yes Historical Provider, MD  Multiple Vitamins-Minerals (MULTIVITAMIN WITH MINERALS) tablet Take 1 tablet by mouth daily.   Yes Historical Provider, MD  pravastatin (PRAVACHOL) 40 MG tablet Take 40 mg by mouth at bedtime.    Yes Historical Provider, MD   predniSONE (DELTASONE) 5 MG tablet Take 1 tablet (5 mg total) by mouth daily with breakfast. Take for 30 days then stop. Patient taking differently: Take 5 mg by mouth daily.  01/08/15  Yes Forest Gleason, MD  promethazine (PHENERGAN) 25 MG tablet Take 25 mg by mouth every 6 (six) hours as needed for nausea or vomiting.   Yes Historical Provider, MD      VITAL SIGNS:  Blood pressure 103/57, pulse 85, temperature 99.4 F (37.4 C), temperature source Oral, resp. rate 16, height 5\' 6"  (1.676 m), weight 125 lb (56.7 kg), SpO2 99 %.  PHYSICAL EXAMINATION:  VITAL SIGNS: Filed Vitals:   02/13/15 2200  BP: 103/57  Pulse: 85  Temp:   Resp:    GENERAL:69 y.o.female currently in no acute distress.  HEAD: Normocephalic, atraumatic.  EYES: Pupils equal, round, reactive to light. Extraocular muscles intact. No scleral icterus.  MOUTH: Moist mucosal membrane. Dentition poor. No abscess noted.  EAR, NOSE, THROAT: Clear without exudates. No external lesions.  NECK: Supple. No thyromegaly. No nodules. No JVD.  PULMONARY: Clear to ascultation, without wheeze rails or rhonci. No use of accessory muscles, Good respiratory effort. good air entry bilaterally CHEST: Nontender to palpation.  CARDIOVASCULAR: S1 and S2. Regular rate and rhythm. No murmurs, rubs, or gallops. No edema. Pedal pulses 2+ bilaterally.  GASTROINTESTINAL: Soft, nontender, nondistended. No masses. Positive bowel sounds. No hepatosplenomegaly.  MUSCULOSKELETAL: No swelling, clubbing, or edema. Range of motion full in all extremities.  NEUROLOGIC: Cranial nerves II through XII are intact. No gross focal neurological deficits. Sensation intact. Reflexes intact.  SKIN: No ulceration, lesions, rashes, or cyanosis. Skin warm and dry. Turgor intact.  PSYCHIATRIC: Mood, affect within normal limits. The patient is awake, alert and oriented x 3. Insight, judgment intact.    LABORATORY PANEL:   CBC  Recent Labs Lab 02/13/15 2005  WBC  79.1*  HGB 6.0*  HCT 18.1*  PLT 13*   ------------------------------------------------------------------------------------------------------------------  Chemistries   Recent Labs Lab 02/13/15 2005  NA 132*  K 4.6  CL 103  CO2 21*  GLUCOSE 143*  BUN 36*  CREATININE 0.79  CALCIUM 8.0*   ------------------------------------------------------------------------------------------------------------------  Cardiac Enzymes No results for input(s): TROPONINI in the last 168 hours. ------------------------------------------------------------------------------------------------------------------  RADIOLOGY:  No results found.  EKG:   Orders placed or performed during the hospital encounter of 01/31/15  . EKG 12-Lead  . EKG 12-Lead  . EKG 12-Lead  . EKG 12-Lead  . EKG    IMPRESSION AND PLAN:   69 year old African-American female history of CML presenting with fatigue  1. Symptomatic anemia: Fecal occult blood test negative in emergency department, continue with blood transfusion as previously ordered, follow CBC, consult oncology positive for Dr. Jeb Levering 2. Type 2 diabetes non insulin: Hold oral agents at insulin sliding scale with every 6 hours Accu-Chek 3. Essential hypertension continue atenolol, lisinopril and excellent  4. Venous thromboembolism prophylactic: Not required given thrombocytopenia     All the records are reviewed and case discussed with ED provider. Management plans discussed with the patient,  family and they are in agreement.  CODE STATUS: Full  TOTAL TIME TAKING CARE OF THIS PATIENT: 35 minutes.    Hower,  Karenann Cai.D on 02/13/2015 at 10:09 PM  Between 7am to 6pm - Pager - [phone]  After 6pm: House Pager: - [phone]  [facility]  Office  [phone]  CC: Primary care physician; Elton Sin, DO

## 2015-02-13 NOTE — ED Provider Notes (Signed)
[facility] Emergency Department Provider Note  ____________________________________________   I have reviewed the triage vital signs and the nursing notes.   HISTORY  Chief Complaint Fatigue    HPI Nancie Bocanegra is a 69 y.o. female sent here for a transfusion. Has history of CML has had to have transfusions in the past. No recent GI bleeding. She feels somewhat generally weak. Sent in by her oncologist. Denies fever chills or nausea or vomiting. Has had loose stools in the past. No melena or bright red blood per rectum  Past Medical History  Diagnosis Date  . Ovarian cancer ([place]) 2005    endometrial cacner  . Hypertension   . Diabetes mellitus without complication ([place])   . Shortness of breath dyspnea   . History of chemotherapy 2005  . CML (chronic myelocytic leukemia) ([place])   . CML (chronic myelocytic leukemia) [facility])     Patient Active Problem List   Diagnosis Date Noted  . Pressure ulcer 02/03/2015  . Chronic blood loss anemia 01/31/2015  . Ovarian cancer ([place]) 11/26/2014    Past Surgical History  Procedure Laterality Date  . Abdominal hysterectomy  2005    endometrial cancer  . Ovary surgery Bilateral   . Colonoscopy  2012 ?    Current Outpatient Rx  Name  Route  Sig  Dispense  Refill  . acetaminophen (TYLENOL) 325 MG tablet   Oral   Take 650 mg by mouth every 4 (four) hours as needed for mild pain or moderate pain.         Marland Kitchen allopurinol (ZYLOPRIM) 100 MG tablet   Oral   Take 2 tablets (200 mg total) by mouth daily.   30 tablet   0   . atenolol (TENORMIN) 50 MG tablet   Oral   Take 50 mg by mouth daily.         Marland Kitchen glipiZIDE (GLUCOTROL) 10 MG tablet   Oral   Take 10 mg by mouth 2 (two) times daily.         Marland Kitchen lisinopril-hydrochlorothiazide (PRINZIDE,ZESTORETIC) 20-12.5 MG per tablet   Oral   Take 1 tablet by mouth daily.         . metFORMIN (GLUCOPHAGE) 1000 MG tablet   Oral   Take 1,000 mg by mouth 2 (two)  times daily.          . Multiple Vitamins-Minerals (MULTIVITAMIN WITH MINERALS) tablet   Oral   Take 1 tablet by mouth daily.         . pravastatin (PRAVACHOL) 40 MG tablet   Oral   Take 40 mg by mouth at bedtime.          . predniSONE (DELTASONE) 5 MG tablet   Oral   Take 1 tablet (5 mg total) by mouth daily with breakfast. Take for 30 days then stop. Patient taking differently: Take 5 mg by mouth daily.    30 tablet   0   . promethazine (PHENERGAN) 25 MG tablet   Oral   Take 25 mg by mouth every 6 (six) hours as needed for nausea or vomiting.           Allergies Review of patient's allergies indicates no known allergies.  Family History  Problem Relation Age of Onset  . Cancer Sister     breast ? late 53's  . Cancer Sister      breast mid 81's  . Cancer Cousin     lung late 59's /paternal cousin  Social History Social History  Substance Use Topics  . Smoking status: Former Smoker    Quit date: 12/06/1958  . Smokeless tobacco: Never Used  . Alcohol Use: No    Review of Systems Constitutional: No fever/chills Eyes: No visual changes. ENT: No sore throat. No stiff neck no neck pain Cardiovascular: Denies chest pain. Respiratory: Denies shortness of breath. Gastrointestinal:   no vomiting.    No constipation. Genitourinary: Negative for dysuria. Musculoskeletal: Negative lower extremity swelling Skin: Negative for rash. Neurological: Negative for headaches, focal weakness or numbness. 10-point ROS otherwise negative.  ____________________________________________   PHYSICAL EXAM:  VITAL SIGNS: ED Triage Vitals  Enc Vitals Group     BP 02/13/15 1847 98/62 mmHg     Pulse Rate 02/13/15 1847 91     Resp 02/13/15 1847 16     Temp 02/13/15 1847 99.4 F (37.4 C)     Temp Source 02/13/15 1847 Oral     SpO2 02/13/15 1847 98 %     Weight 02/13/15 1847 125 lb (56.7 kg)     Height 02/13/15 1847 5\' 6"  (1.676 m)     Head Cir --      Peak Flow --       Pain Score --      Pain Loc --      Pain Edu? --      Excl. in [place]? --     Constitutional: Alert and oriented. Well appearing and in no acute distress. Eyes: Conjunctivae are normal. PERRL. EOMI. Head: Atraumatic. Nose: No congestion/rhinnorhea. Mouth/Throat: Mucous membranes are moist.  Oropharynx non-erythematous. Neck: No stridor.   Nontender with no meningismus Cardiovascular: Normal rate, regular rhythm. Grossly normal heart sounds.  Good peripheral circulation. Respiratory: Normal respiratory effort.  No retractions. Lungs CTAB. Gastrointestinal: Soft and nontender. No distention. No guarding no rebound Rectal exam: Female chaperone present, guaiac-negative brown stool Back:  There is no focal tenderness or step off there is no midline tenderness there are no lesions noted. there is no CVA tenderness Musculoskeletal: No lower extremity tenderness. No joint effusions, no DVT signs strong distal pulses no edema Neurologic:  Normal speech and language. No gross focal neurologic deficits are appreciated.  Skin:  Skin is warm, dry and intact. No rash noted. Psychiatric: Mood and affect are normal. Speech and behavior are normal.  ____________________________________________   LABS (all labs ordered are listed, but only abnormal results are displayed)  Labs Reviewed  PROTIME-INR - Abnormal; Notable for the following:    Prothrombin Time 17.5 (*)    All other components within normal limits  BASIC METABOLIC PANEL - Abnormal; Notable for the following:    Sodium 132 (*)    CO2 21 (*)    Glucose, Bld 143 (*)    BUN 36 (*)    Calcium 8.0 (*)    All other components within normal limits  CBC WITH DIFFERENTIAL/PLATELET - Abnormal; Notable for the following:    WBC 79.1 (*)    RBC 1.81 (*)    Hemoglobin 6.0 (*)    HCT 18.1 (*)    RDW 25.6 (*)    Platelets 13 (*)    nRBC 3 (*)    Neutro Abs 34.8 (*)    Lymphs Abs 10.3 (*)    Monocytes Absolute 4.0 (*)    Eosinophils  Absolute 20.6 (*)    Basophils Absolute 7.9 (*)    All other components within normal limits  TYPE AND SCREEN  PREPARE RBC (CROSSMATCH)  ____________________________________________  EKG   ____________________________________________  RADIOLOGY   ____________________________________________   PROCEDURES  Procedure(s) performed: None  Critical Care performed: None  ____________________________________________   INITIAL IMPRESSION / ASSESSMENT AND PLAN / ED COURSE  Pertinent labs & imaging results that were available during my care of the patient were reviewed by me and considered in my medical decision making (see chart for details).  Patient here for transfusion from her CML, we will give her 2 units, talk to the hospitalist and they will admit her for observation. A shin nontoxic in appearance at this time. ____________________________________________   FINAL CLINICAL IMPRESSION(S) / ED DIAGNOSES  Final diagnoses:  None     Schuyler Amor, MD 02/13/15 2147

## 2015-02-13 NOTE — ED Notes (Signed)
Pt cleaned up from large very loose bm

## 2015-02-13 NOTE — ED Notes (Signed)
Pt has history of cml (chronic meloid leukemia) with the need for blood transfusions. H&h 6.2 and 2.03; platelets 15; wbc 73

## 2015-02-14 ENCOUNTER — Other Ambulatory Visit: Payer: Self-pay | Admitting: *Deleted

## 2015-02-14 DIAGNOSIS — C9210 Chronic myeloid leukemia, BCR/ABL-positive, not having achieved remission: Secondary | ICD-10-CM

## 2015-02-14 LAB — BASIC METABOLIC PANEL
Anion gap: 7 (ref 5–15)
BUN: 29 mg/dL — AB (ref 6–20)
CALCIUM: 7.9 mg/dL — AB (ref 8.9–10.3)
CHLORIDE: 105 mmol/L (ref 101–111)
CO2: 20 mmol/L — ABNORMAL LOW (ref 22–32)
CREATININE: 0.63 mg/dL (ref 0.44–1.00)
GFR calc non Af Amer: >60 mL/min (ref >60)
Glucose, Bld: 64 mg/dL — ABNORMAL LOW (ref 65–99)
Potassium: 5.1 mmol/L (ref 3.5–5.1)
SODIUM: 132 mmol/L — AB (ref 135–145)

## 2015-02-14 LAB — CBC
HCT: 23.9 % — ABNORMAL LOW (ref 35.0–47.0)
HEMOGLOBIN: 8.1 g/dL — AB (ref 12.0–16.0)
MCH: 31.6 pg (ref 26.0–34.0)
MCHC: 34.1 g/dL (ref 32.0–36.0)
MCV: 92.7 fL (ref 80.0–100.0)
PLATELETS: 21 10*3/uL — AB (ref 150–440)
RBC: 2.57 MIL/uL — ABNORMAL LOW (ref 3.80–5.20)
RDW: 23.3 % — AB (ref 11.5–14.5)
WBC: 71.9 10*3/uL (ref 3.6–11.0)

## 2015-02-14 LAB — GLUCOSE, CAPILLARY
Glucose-Capillary: 57 mg/dL — ABNORMAL LOW (ref 65–99)
Glucose-Capillary: 68 mg/dL (ref 65–99)
Glucose-Capillary: 92 mg/dL (ref 65–99)
Glucose-Capillary: 94 mg/dL (ref 65–99)

## 2015-02-14 LAB — MRSA PCR SCREENING: MRSA BY PCR: NEGATIVE

## 2015-02-14 LAB — PREPARE RBC (CROSSMATCH)

## 2015-02-14 MED ORDER — HYDROCHLOROTHIAZIDE 12.5 MG PO CAPS
12.5000 mg | ORAL_CAPSULE | Freq: Every day | ORAL | Status: DC
  Administered 2015-02-14: 11:00:00 12.5 mg via ORAL
  Filled 2015-02-14: qty 1

## 2015-02-14 MED ORDER — INSULIN ASPART 100 UNIT/ML ~~LOC~~ SOLN: 0.0000 [IU] | Freq: Every day | SUBCUTANEOUS | Status: DC

## 2015-02-14 MED ORDER — ALLOPURINOL 100 MG PO TABS: 200.0000 mg | ORAL_TABLET | Freq: Every day | ORAL | Status: DC

## 2015-02-14 MED ORDER — ATENOLOL 50 MG PO TABS
50.0000 mg | ORAL_TABLET | Freq: Every day | ORAL | Status: DC
  Administered 2015-02-14: 11:00:00 50 mg via ORAL
  Filled 2015-02-14: qty 1

## 2015-02-14 MED ORDER — PRAVASTATIN SODIUM 20 MG PO TABS
40.0000 mg | ORAL_TABLET | Freq: Every day | ORAL | Status: DC
  Administered 2015-02-14: 01:00:00 40 mg via ORAL
  Filled 2015-02-14: qty 2

## 2015-02-14 MED ORDER — LISINOPRIL-HYDROCHLOROTHIAZIDE 20-12.5 MG PO TABS: 1.0000 | ORAL_TABLET | Freq: Every day | ORAL | Status: DC

## 2015-02-14 MED ORDER — INSULIN ASPART 100 UNIT/ML ~~LOC~~ SOLN: 0.0000 [IU] | Freq: Three times a day (TID) | SUBCUTANEOUS | Status: DC

## 2015-02-14 MED ORDER — PREDNISONE 5 MG PO TABS
5.0000 mg | ORAL_TABLET | Freq: Every day | ORAL | Status: DC
  Administered 2015-02-14: 5 mg via ORAL
  Filled 2015-02-14: qty 1

## 2015-02-14 MED ORDER — ADULT MULTIVITAMIN W/MINERALS CH: 1.0000 | ORAL_TABLET | Freq: Every day | ORAL | Status: DC

## 2015-02-14 MED ORDER — LISINOPRIL 20 MG PO TABS
20.0000 mg | ORAL_TABLET | Freq: Every day | ORAL | Status: DC
  Administered 2015-02-14: 11:00:00 20 mg via ORAL
  Filled 2015-02-14: qty 1

## 2015-02-14 NOTE — Clinical Social Work Note (Signed)
Clinical Social Work Assessment  Patient Details  Name: Mikayla Keller MRN: [id number] Date of Birth: November 17, 1945  Date of referral:  02/14/15               Reason for consult:  Facility Placement                Permission sought to share information with:  Facility Art therapist granted to share information::  Yes, Verbal Permission Granted  Name::        Agency::     Relationship::     Contact Information:     Housing/Transportation Living arrangements for the past 2 months:  [place] of Information:  Patient Patient Interpreter Needed:  None Criminal Activity/Legal Involvement Pertinent to Current Situation/Hospitalization:  No - Comment as needed Significant Relationships:    Lives with:  Facility Resident Do you feel safe going back to the place where you live?  Yes Need for family participation in patient care:  No (Coment)  Care giving concerns:  Patient from [facility] where she is receiving rehab.   Social Worker assessment / plan:  Patient admitted yesterday and to discharge back to [facility] today. Patient is in agreement and is requesting transport via EMS. Discharge summary sent.   Employment status:  Retired Forensic scientist:  Medicare PT Recommendations:  Not assessed at this time Information / Referral to community resources:     Patient/Family's Response to care:  Patient expressed appreciation for CSW assistance.  Patient/Family's Understanding of and Emotional Response to Diagnosis, Current Treatment, and Prognosis:  Patient was smiling and happy to be able to return today to [facility].  Emotional Assessment Appearance:  Appears stated age Attitude/Demeanor/Rapport:   (Pleasant and cooperative) Affect (typically observed):  Accepting, Appropriate Orientation:  Oriented to Self, Oriented to Place, Oriented to  Time, Oriented to Situation Alcohol / Substance use:  Not Applicable Psych involvement (Current and /or in the  community):  No (Comment)  Discharge Needs  Concerns to be addressed:  No discharge needs identified Readmission within the last 30 days:  No Current discharge risk:  None Barriers to Discharge:  No Barriers Identified   Shela Leff, LCSW 02/14/2015, 10:58 AM

## 2015-02-14 NOTE — Evaluation (Signed)
Physical Therapy Evaluation Patient Details Name: Mikayla Keller MRN: [id number] DOB: [birth year] Today's Date: 02/14/2015   History of Present Illness  Patient is a 69 y.o. female admitted for chronic blood loss and anemia. Patient has received 3 units of blood since being admitted on 23 September. Patient has hx of HTN, Diabetes, and chronic myelocytic leukemia. Patient has hx of falls.  Clinical Impression  Pt complains of slight dizziness at baseline and denies any pain or discomfort. Pt displayed ability to perform bed mobility with modified independence using the bed rails for assistance with supine<>sitting on EOB transfer. Pt is min assist with sit<>stand transfer; pt unable to get to standing on first attempt which demonstrates her decreased functional strength in BLEs and increased risk for falls. Pt was min guard for ambulation w/ RW. Required cues to keep walker close to body. Pt states that she gets dizzy when ambulating at home >10 ft. Pt did report some mild dizziness with activity throughout the evaluation. Pt's SpO2% was taken intermittently throughout and never found to be less than 99%. Pt requires further skilled acute PT services in order to increase her capacity for functional mobility and increase LE strength. Pt is appropriate for d/c back to SNF where she was prior to admission to hospital.     Follow Up Recommendations SNF    Equipment Recommendations  Rolling walker with 5" wheels    Recommendations for Other Services       Precautions / Restrictions Precautions Precautions: Fall Restrictions Weight Bearing Restrictions: No      Mobility  Bed Mobility Overal bed mobility: Modified Independent             General bed mobility comments: used bed rails for assistance with supine to sit transfer.   Transfers Overall transfer level: Needs assistance Equipment used: Rolling walker (2 wheeled) Transfers: Sit to/from Stand   Stand pivot transfers: Min  assist       General transfer comment: Pt reports dizziness with standing, first attempt to stand pt was unable and had to sit back down. Was able to complete sit to stand transfer with adequate trunk momentum and BUE support on RW.   Ambulation/Gait Ambulation/Gait assistance: Min guard Ambulation Distance (Feet): 10 Feet Assistive device: Rolling walker (2 wheeled) Gait Pattern/deviations: Step-to pattern;Decreased step length - right;Decreased step length - left;Decreased stride length;Trunk flexed Gait velocity: decreased Gait velocity interpretation: <1.8 ft/sec, indicative of risk for recurrent falls General Gait Details: pt needs to be reminded to keep walker close to her body; slight help given to help steer walker in right direction  Stairs            Wheelchair Mobility    Modified Rankin (Stroke Patients Only)       Balance Overall balance assessment: Needs assistance Sitting-balance support: No upper extremity supported;Feet supported Sitting balance-Leahy Scale: Good     Standing balance support: During functional activity;Bilateral upper extremity supported Standing balance-Leahy Scale: Fair Standing balance comment: BUE support on RW required for adequate balance                             Pertinent Vitals/Pain Pain Assessment: No/denies pain    Home Living Family/patient expects to be discharged to:: Skilled nursing facility Living Arrangements: Other (Comment) (SNF) Available Help at Discharge: Available PRN/intermittently;Friend(s);Family Type of Home: Mobile home Home Access: Stairs to enter   Entrance Stairs-Number of Steps: 4 Home Layout: One level Home Equipment:  None      Prior Function Level of Independence: Needs assistance   Gait / Transfers Assistance Needed: pt states that she holds onto furniture/ her son when ambulating in the house.            Hand Dominance        Extremity/Trunk Assessment   Upper  Extremity Assessment: Overall WFL for tasks assessed (MMT BUE strength 3/5)           Lower Extremity Assessment:  (MMT BLE strength 3+/5)      Cervical / Trunk Assessment: Normal  Communication   Communication: No difficulties  Cognition Arousal/Alertness: Awake/alert Behavior During Therapy: WFL for tasks assessed/performed Overall Cognitive Status: Within Functional Limits for tasks assessed                      General Comments      Exercises        Assessment/Plan    PT Assessment Patient needs continued PT services  PT Diagnosis     PT Problem List Decreased strength;Decreased activity tolerance;Decreased balance;Decreased mobility  PT Treatment Interventions DME instruction;Gait training;Stair training;Functional mobility training;Therapeutic activities;Therapeutic exercise;Balance training   PT Goals (Current goals can be found in the Care Plan section) Acute Rehab PT Goals Patient Stated Goal: To move around better PT Goal Formulation: With patient Time For Goal Achievement: 02/15/15 Potential to Achieve Goals: Good    Frequency Min 2X/week   Barriers to discharge        Co-evaluation               End of Session Equipment Utilized During Treatment: Gait belt Activity Tolerance: Patient tolerated treatment well Patient left: with chair alarm set;in chair;with call bell/phone within reach Nurse Communication: Mobility status         Time: 1030-1050 PT Time Calculation (min) (ACUTE ONLY): 20 min   Charges:         PT G CodesMilon Score 02/14/2015, 12:07 PM

## 2015-02-14 NOTE — Discharge Planning (Signed)
Called [facility] x3, gave report sara

## 2015-02-14 NOTE — Discharge Summary (Signed)
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 Hypertension   . Diabetes mellitus without complication ([place])   . Shortness of breath dyspnea   . History of chemotherapy 2005  . CML (chronic myelocytic leukemia) ([place])   . CML (chronic myelocytic leukemia) [facility])     HOSPITAL COURSE:  69 year old African-American female history of CML presenting with fatigue  1. Symptomatic anemia: Fecal occult blood test negative in emergency department -s/p 2 unit BT -hgb 6.0--.8.1 -pt denies any rectal bleed. Doing ok -was seen by GI 10 days ago. No luminal eval given low plt -pt will f/u Monday with [facility],NP/dr Choksi. This was d/w Dr Ma Hillock  2. Type 2 diabetes non insulin: resume oral agents  -cont insulin sliding scale with every 6 hours Accu-Chek  3. Essential hypertension continue atenolol, lisinopril and excellent   4. Venous thromboembolism prophylactic: Not required given thrombocytopenia  5. H/o CML  Pt is suppose to be on Gleevac and rprednisone Apparently she is not taking it due to the bas taste of the medication. Will defer this issue to Oncology. D/c to [facility]   CONSULTS OBTAINED:  Treatment Team:  Lloyd Huger, MD  DRUG ALLERGIES:  No Known Allergies  DISCHARGE MEDICATIONS:   Current Discharge Medication List    CONTINUE these medications which have NOT CHANGED   Details   acetaminophen (TYLENOL) 325 MG tablet Take 650 mg by mouth every 4 (four) hours as needed for mild pain or moderate pain.    allopurinol (ZYLOPRIM) 100 MG tablet Take 2 tablets (200 mg total) by mouth daily. Qty: 30 tablet, Refills: 0    atenolol (TENORMIN) 50 MG tablet Take 50 mg by mouth daily.    glipiZIDE (GLUCOTROL) 10 MG tablet Take 10 mg by mouth 2 (two) times daily.    lisinopril-hydrochlorothiazide (PRINZIDE,ZESTORETIC) 20-12.5 MG per tablet Take 1 tablet by mouth daily.    metFORMIN (GLUCOPHAGE) 1000 MG tablet Take 1,000 mg by mouth 2 (two) times daily.     Multiple Vitamins-Minerals (MULTIVITAMIN WITH MINERALS) tablet Take 1 tablet by mouth daily.    pravastatin (PRAVACHOL) 40 MG tablet Take 40 mg by mouth at bedtime.    Associated Diagnoses: Ovarian cancer, unspecified laterality ([place]); Loss of weight    predniSONE (DELTASONE) 5 MG tablet Take 1 tablet (5 mg total) by mouth daily with breakfast. Take for 30 days then stop. Qty: 30 tablet, Refills: 0   Associated Diagnoses: CML (chronic myeloid leukemia) (HCC)    promethazine (PHENERGAN) 25 MG tablet Take 25 mg by mouth every 6 (six) hours as needed for nausea or vomiting.        If you experience worsening of your admission symptoms, develop shortness of breath, life threatening emergency, suicidal or homicidal thoughts you must seek medical attention immediately by calling 911 or calling your MD immediately  if symptoms less severe.  You Must read complete instructions/literature along with all the  possible adverse reactions/side effects for all the Medicines you take and that have been prescribed to you. Take any new Medicines after you have completely understood and accept all the possible adverse reactions/side effects.   Please note  You were cared for by a hospitalist during your hospital stay. If you have any questions about your discharge medications or the care you received while you were in the hospital after  you are discharged, you can call the unit and asked to speak with the hospitalist on call if the hospitalist that took care of you is not available. Once you are discharged, your primary care physician will handle any further medical issues. Please note that NO REFILLS for any discharge medications will be authorized once you are discharged, as it is imperative that you return to your primary care physician (or establish a relationship with a primary care physician if you do not have one) for your aftercare needs so that they can reassess your need for medications and monitor your lab values. Today   SUBJECTIVE   i am hungry  VITAL SIGNS:  Blood pressure 113/59, pulse 85, temperature 99.1 F (37.3 C), temperature source Oral, resp. rate 18, height 5\' 6"  (1.676 m), weight 61.961 kg (136 lb 9.6 oz), SpO2 100 %.  I/O:   Intake/Output Summary (Last 24 hours) at 02/14/15 0827 Last data filed at 02/14/15 0615  Gross per 24 hour  Intake    600 ml  Output      0 ml  Net    600 ml    PHYSICAL EXAMINATION:  GENERAL:  69 y.o.-year-old patient lying in the bed with no acute distress.  EYES: Pupils equal, round, reactive to light and accommodation. No scleral icterus. Extraocular muscles intact.  HEENT: Head atraumatic, normocephalic. Oropharynx and nasopharynx clear.  NECK:  Supple, no jugular venous distention. No thyroid enlargement, no tenderness.  LUNGS: Normal breath sounds bilaterally, no wheezing, rales,rhonchi or crepitation. No use of accessory muscles of respiration.  CARDIOVASCULAR: S1, S2 normal. No murmurs, rubs, or gallops.  ABDOMEN: Soft, non-tender, non-distended. Bowel sounds present. No organomegaly or mass.  EXTREMITIES: No pedal edema, cyanosis, or clubbing.  NEUROLOGIC: Cranial nerves II through XII are intact. Muscle strength 5/5 in all extremities. Sensation intact. Gait not checked. weak PSYCHIATRIC: The patient is alert and oriented x 3.  SKIN: No obvious rash, lesion, or  ulcer.   DATA REVIEW:   CBC   Recent Labs Lab 02/14/15 0641  WBC 71.9*  HGB 8.1*  HCT 23.9*  PLT 21*    Chemistries   Recent Labs Lab 02/14/15 0641  NA 132*  K 5.1  CL 105  CO2 20*  GLUCOSE 64*  BUN 29*  CREATININE 0.63  CALCIUM 7.9*    Microbiology Results   Recent Results (from the past 240 hour(s))  MRSA PCR Screening     Status: None   Collection Time: 02/13/15 11:53 PM  Result Value Ref Range Status   MRSA by PCR NEGATIVE NEGATIVE Final    Comment:        The GeneXpert MRSA Assay (FDA approved for NASAL specimens only), is one component of a comprehensive MRSA colonization surveillance program. It is not intended to diagnose MRSA infection nor to guide or monitor treatment for MRSA infections.     RADIOLOGY:  No results found.   Management plans discussed with the patient, family and they are in agreement.  CODE STATUS:     Code Status Orders  Start     Ordered   02/13/15 2151  Full code   Continuous     02/13/15 2151      TOTAL TIME TAKING CARE OF THIS PATIENT: 40 minutes.    Dannis Deroche M.D on 02/14/2015 at 8:27 AM  Between 7am to 6pm - Pager - [phone] After 6pm go to [email or web address] - password [email or web address] [place] [facility]  Office  [phone]  CC: Primary care physician; Elton Sin, DO

## 2015-02-14 NOTE — Plan of Care (Signed)
Problem: Discharge Progression Outcomes Goal: Other Discharge Outcomes/Goals Outcome: Progressing Plan of care progress to goal: Pt has no pain this shift Received 2 units of blood VSS Tolerating cardiac diet

## 2015-02-14 NOTE — H&P (Signed)
Report called to facility, [facility]. Pain x 1 to her buttocks area and pain medication was given x1 with noted relief. IV removed. HBG 8.1 at discharge. VSS.

## 2015-02-14 NOTE — Discharge Instructions (Signed)
Resume PT Anemia, Nonspecific Anemia is a condition in which the concentration of red blood cells or hemoglobin in the blood is below normal. Hemoglobin is a substance in red blood cells that carries oxygen to the tissues of the body. Anemia results in not enough oxygen reaching these tissues.  CAUSES  Common causes of anemia include:   Excessive bleeding. Bleeding may be internal or external. This includes excessive bleeding from periods (in women) or from the intestine.   Poor nutrition.   Chronic kidney, thyroid, and liver disease.  Bone marrow disorders that decrease red blood cell production.  Cancer and treatments for cancer.  HIV, AIDS, and their treatments.  Spleen problems that increase red blood cell destruction.  Blood disorders.  Excess destruction of red blood cells due to infection, medicines, and autoimmune disorders. SIGNS AND SYMPTOMS   Minor weakness.   Dizziness.   Headache.  Palpitations.   Shortness of breath, especially with exercise.   Paleness.  Cold sensitivity.  Indigestion.  Nausea.  Difficulty sleeping.  Difficulty concentrating. Symptoms may occur suddenly or they may develop slowly.  DIAGNOSIS  Additional blood tests are often needed. These help your health care provider determine the best treatment. Your health care provider will check your stool for blood and look for other causes of blood loss.  TREATMENT  Treatment varies depending on the cause of the anemia. Treatment can include:   Supplements of iron, vitamin A83, or folic acid.   Hormone medicines.   A blood transfusion. This may be needed if blood loss is severe.   Hospitalization. This may be needed if there is significant continual blood loss.   Dietary changes.  Spleen removal. HOME CARE INSTRUCTIONS Keep all follow-up appointments. It often takes many weeks to correct anemia, and having your health care provider check on your condition and your  response to treatment is very important. SEEK IMMEDIATE MEDICAL CARE IF:   You develop extreme weakness, shortness of breath, or chest pain.   You become dizzy or have trouble concentrating.  You develop heavy vaginal bleeding.   You develop a rash.   You have bloody or black, tarry stools.   You faint.   You vomit up blood.   You vomit repeatedly.   You have abdominal pain.  You have a fever or persistent symptoms for more than 2-3 days.   You have a fever and your symptoms suddenly get worse.   You are dehydrated.  MAKE SURE YOU:  Understand these instructions.  Will watch your condition.  Will get help right away if you are not doing well or get worse.   This information is not intended to replace advice given to you by your health care provider. Make sure you discuss any questions you have with your health care provider.   Document Released: 06/03/2004 Document Revised: 12/27/2012 Document Reviewed: 10/20/2012 Elsevier Interactive Patient Education 2016 [place].  Blood Transfusion  A blood transfusion is a procedure in which you receive donated blood through an IV tube. You may need a blood transfusion because of illness, surgery, or injury. The blood may come from a donor, or it may be your own blood that you donated previously. The blood given in a transfusion is made up of different types of cells. You may receive:  Red blood cells. These carry oxygen and replace lost blood.  Platelets. These control bleeding.  Plasma. Thishelps blood to clot. If you have hemophilia or another clotting disorder, you may also receive other  types of blood products. LET [facility] CARE PROVIDER KNOW ABOUT:  Any allergies you have.  All medicines you are taking, including vitamins, herbs, eye drops, creams, and over-the-counter medicines.  Previous problems you or members of your family have had with the use of anesthetics.  Any blood disorders you  have.  Previous surgeries you have had.  Any medical conditions you may have.  Any previous reactions you have had during a blood transfusion.  RISKS AND COMPLICATIONS Generally, this is a safe procedure. However, problems may occur, including:  Having an allergic reaction to something in the donated blood.  Fever. This may be a reaction to the white blood cells in the transfused blood.  Iron overload. This can happen from having many transfusions.  Transfusion-related acute lung injury (TRALI). This is a rare reaction that causes lung damage. The cause is not known.TRALI can occur within hours of a transfusion or several days later.  Sudden (acute) or delayed hemolytic reactions. This happens if your blood does not match the cells in your transfusion. Your body's defense system (immune system) may try to attack the new cells. This complication is rare.  Infection. This is rare. BEFORE THE PROCEDURE  You may have a blood test to determine your blood type. This is necessary to know what kind of blood your body will accept.  If you are going to have a planned surgery, you may donate your own blood. This may be done in case you need to have a transfusion.  If you have had an allergic reaction to a transfusion in the past, you may be given medicine to help prevent a reaction. Take this medicine only as directed by your health care provider.  You will have your temperature, blood pressure, and pulse monitored before the transfusion. PROCEDURE   An IV will be started in your hand or arm.  The bag of donated blood will be attached to your IV tube and given into your vein.  Your temperature, blood pressure, and pulse will be monitored regularly during the transfusion. This monitoring is done to detect early signs of a transfusion reaction.  If you have any signs or symptoms of a reaction, your transfusion will be stopped and you may be given medicine.  When the transfusion is over,  your IV will be removed.  Pressure may be applied to the IV site for a few minutes.  A bandage (dressing) will be applied. The procedure may vary among health care providers and hospitals. AFTER THE PROCEDURE  Your blood pressure, temperature, and pulse will be monitored regularly.   This information is not intended to replace advice given to you by your health care provider. Make sure you discuss any questions you have with your health care provider.   Document Released: 04/23/2000 Document Revised: 05/17/2014 Document Reviewed: 03/06/2014 Elsevier Interactive Patient Education [employer].

## 2015-02-15 LAB — TYPE AND SCREEN
ABO/RH(D): O POS
Antibody Screen: NEGATIVE
UNIT DIVISION: 0
Unit division: 0

## 2015-02-17 ENCOUNTER — Inpatient Hospital Stay: Payer: Medicare Other | Admitting: Family Medicine

## 2015-02-17 ENCOUNTER — Inpatient Hospital Stay: Payer: Medicare Other

## 2015-02-24 ENCOUNTER — Encounter: Payer: Self-pay | Admitting: Emergency Medicine

## 2015-02-24 ENCOUNTER — Emergency Department: Payer: Medicare Other

## 2015-02-24 ENCOUNTER — Inpatient Hospital Stay
Admission: EM | Admit: 2015-02-24 | Discharge: 2015-02-26 | DRG: 871 | Disposition: A | Discharge status: Hospice | Payer: Medicare Other | Attending: Internal Medicine | Admitting: Internal Medicine

## 2015-02-24 ENCOUNTER — Other Ambulatory Visit: Payer: Self-pay

## 2015-02-24 ENCOUNTER — Inpatient Hospital Stay: Payer: Medicare Other

## 2015-02-24 DIAGNOSIS — G9341 Metabolic encephalopathy: Secondary | ICD-10-CM

## 2015-02-24 DIAGNOSIS — C9210 Chronic myeloid leukemia, BCR/ABL-positive, not having achieved remission: Secondary | ICD-10-CM

## 2015-02-24 DIAGNOSIS — E1122 Type 2 diabetes mellitus with diabetic chronic kidney disease: Secondary | ICD-10-CM | POA: Diagnosis present

## 2015-02-24 DIAGNOSIS — Z87891 Personal history of nicotine dependence: Secondary | ICD-10-CM | POA: Diagnosis not present

## 2015-02-24 DIAGNOSIS — E860 Dehydration: Secondary | ICD-10-CM | POA: Diagnosis present

## 2015-02-24 DIAGNOSIS — Z8542 Personal history of malignant neoplasm of other parts of uterus: Secondary | ICD-10-CM | POA: Diagnosis not present

## 2015-02-24 DIAGNOSIS — Z515 Encounter for palliative care: Secondary | ICD-10-CM | POA: Diagnosis not present

## 2015-02-24 DIAGNOSIS — N179 Acute kidney failure, unspecified: Secondary | ICD-10-CM | POA: Diagnosis present

## 2015-02-24 DIAGNOSIS — J159 Unspecified bacterial pneumonia: Secondary | ICD-10-CM | POA: Diagnosis present

## 2015-02-24 DIAGNOSIS — I509 Heart failure, unspecified: Secondary | ICD-10-CM | POA: Diagnosis present

## 2015-02-24 DIAGNOSIS — R161 Splenomegaly, not elsewhere classified: Secondary | ICD-10-CM | POA: Diagnosis present

## 2015-02-24 DIAGNOSIS — R4182 Altered mental status, unspecified: Secondary | ICD-10-CM | POA: Diagnosis not present

## 2015-02-24 DIAGNOSIS — E875 Hyperkalemia: Secondary | ICD-10-CM

## 2015-02-24 DIAGNOSIS — A419 Sepsis, unspecified organism: Secondary | ICD-10-CM | POA: Diagnosis present

## 2015-02-24 DIAGNOSIS — Z8543 Personal history of malignant neoplasm of ovary: Secondary | ICD-10-CM

## 2015-02-24 DIAGNOSIS — I959 Hypotension, unspecified: Secondary | ICD-10-CM

## 2015-02-24 DIAGNOSIS — N189 Chronic kidney disease, unspecified: Secondary | ICD-10-CM | POA: Diagnosis present

## 2015-02-24 DIAGNOSIS — Z9221 Personal history of antineoplastic chemotherapy: Secondary | ICD-10-CM

## 2015-02-24 DIAGNOSIS — D630 Anemia in neoplastic disease: Secondary | ICD-10-CM | POA: Diagnosis present

## 2015-02-24 DIAGNOSIS — I130 Hypertensive heart and chronic kidney disease with heart failure and stage 1 through stage 4 chronic kidney disease, or unspecified chronic kidney disease: Secondary | ICD-10-CM | POA: Diagnosis present

## 2015-02-24 DIAGNOSIS — R64 Cachexia: Secondary | ICD-10-CM | POA: Diagnosis present

## 2015-02-24 DIAGNOSIS — D649 Anemia, unspecified: Secondary | ICD-10-CM | POA: Diagnosis not present

## 2015-02-24 DIAGNOSIS — D61818 Other pancytopenia: Secondary | ICD-10-CM | POA: Diagnosis not present

## 2015-02-24 DIAGNOSIS — K068 Other specified disorders of gingiva and edentulous alveolar ridge: Secondary | ICD-10-CM | POA: Diagnosis present

## 2015-02-24 DIAGNOSIS — Z66 Do not resuscitate: Secondary | ICD-10-CM | POA: Diagnosis present

## 2015-02-24 DIAGNOSIS — N289 Disorder of kidney and ureter, unspecified: Secondary | ICD-10-CM

## 2015-02-24 DIAGNOSIS — Z6823 Body mass index (BMI) 23.0-23.9, adult: Secondary | ICD-10-CM | POA: Diagnosis not present

## 2015-02-24 DIAGNOSIS — Z794 Long term (current) use of insulin: Secondary | ICD-10-CM | POA: Diagnosis not present

## 2015-02-24 DIAGNOSIS — D696 Thrombocytopenia, unspecified: Secondary | ICD-10-CM | POA: Diagnosis present

## 2015-02-24 DIAGNOSIS — R1011 Right upper quadrant pain: Secondary | ICD-10-CM

## 2015-02-24 DIAGNOSIS — J189 Pneumonia, unspecified organism: Secondary | ICD-10-CM

## 2015-02-24 HISTORY — DX: Leukemia, unspecified not having achieved remission: C95.90

## 2015-02-24 LAB — TROPONIN I: Troponin I: 0.04 ng/mL — ABNORMAL HIGH (ref <0.031)

## 2015-02-24 LAB — URINALYSIS COMPLETE WITH MICROSCOPIC (ARMC ONLY)
Bacteria, UA: NONE SEEN
Bilirubin Urine: NEGATIVE
Glucose, UA: NEGATIVE mg/dL
Hgb urine dipstick: NEGATIVE
KETONES UR: NEGATIVE mg/dL
LEUKOCYTES UA: NEGATIVE
NITRITE: NEGATIVE
Protein, ur: NEGATIVE mg/dL
SPECIFIC GRAVITY, URINE: 1.006 (ref 1.005–1.030)
Squamous Epithelial / LPF: NONE SEEN
pH: 7 (ref 5.0–8.0)

## 2015-02-24 LAB — COMPREHENSIVE METABOLIC PANEL
ALK PHOS: 118 U/L (ref 38–126)
ALT: 18 U/L (ref 14–54)
ANION GAP: 8 (ref 5–15)
AST: 32 U/L (ref 15–41)
Albumin: 2.4 g/dL — ABNORMAL LOW (ref 3.5–5.0)
BILIRUBIN TOTAL: 1.3 mg/dL — AB (ref 0.3–1.2)
BUN: 49 mg/dL — ABNORMAL HIGH (ref 6–20)
CALCIUM: 9.4 mg/dL (ref 8.9–10.3)
CO2: 21 mmol/L — AB (ref 22–32)
Chloride: 100 mmol/L — ABNORMAL LOW (ref 101–111)
Creatinine, Ser: 1.07 mg/dL — ABNORMAL HIGH (ref 0.44–1.00)
GFR calc non Af Amer: 52 mL/min — ABNORMAL LOW (ref >60)
GFR, EST AFRICAN AMERICAN: 60 mL/min — AB (ref >60)
Glucose, Bld: 244 mg/dL — ABNORMAL HIGH (ref 65–99)
Potassium: 6.1 mmol/L — ABNORMAL HIGH (ref 3.5–5.1)
SODIUM: 129 mmol/L — AB (ref 135–145)
TOTAL PROTEIN: 6.1 g/dL — AB (ref 6.5–8.1)

## 2015-02-24 LAB — LACTIC ACID, PLASMA
LACTIC ACID, VENOUS: 1.3 mmol/L (ref 0.5–2.0)
Lactic Acid, Venous: 1.3 mmol/L (ref 0.5–2.0)

## 2015-02-24 LAB — GLUCOSE, CAPILLARY
GLUCOSE-CAPILLARY: 240 mg/dL — AB (ref 65–99)
GLUCOSE-CAPILLARY: 218 mg/dL — AB (ref 65–99)
GLUCOSE-CAPILLARY: 188 mg/dL — AB (ref 65–99)
Glucose-Capillary: 116 mg/dL — ABNORMAL HIGH (ref 65–99)

## 2015-02-24 LAB — PROTIME-INR
INR: 1.61
Prothrombin Time: 19.3 seconds — ABNORMAL HIGH (ref 11.4–15.0)

## 2015-02-24 LAB — MRSA PCR SCREENING: MRSA by PCR: NEGATIVE

## 2015-02-24 LAB — POTASSIUM: Potassium: 5.5 mmol/L — ABNORMAL HIGH (ref 3.5–5.1)

## 2015-02-24 LAB — PREPARE RBC (CROSSMATCH)

## 2015-02-24 MED ORDER — ACETAMINOPHEN 40 MG HALF SUPP
1000.0000 mg | Freq: Once | RECTAL | Status: DC
  Filled 2015-02-24: qty 1

## 2015-02-24 MED ORDER — ACETAMINOPHEN 650 MG RE SUPP: 650.0000 mg | Freq: Four times a day (QID) | RECTAL | Status: DC | PRN

## 2015-02-24 MED ORDER — VANCOMYCIN HCL IN DEXTROSE 1-5 GM/200ML-% IV SOLN: 1000.0000 mg | Freq: Once | INTRAVENOUS | Status: DC

## 2015-02-24 MED ORDER — DEXTROSE 50 % IV SOLN
25.0000 mL | Freq: Once | INTRAVENOUS | Status: AC
  Administered 2015-02-24: 25 mL via INTRAVENOUS
  Filled 2015-02-24: qty 50

## 2015-02-24 MED ORDER — CHLORHEXIDINE GLUCONATE 0.12 % MT SOLN
15.0000 mL | Freq: Two times a day (BID) | OROMUCOSAL | Status: DC
  Administered 2015-02-24 – 2015-02-25 (×3): 15 mL via OROMUCOSAL
  Filled 2015-02-24: qty 15

## 2015-02-24 MED ORDER — SODIUM CHLORIDE 0.9 % IV BOLUS (SEPSIS)
500.0000 mL | Freq: Once | INTRAVENOUS | Status: AC
  Administered 2015-02-24: 500 mL via INTRAVENOUS

## 2015-02-24 MED ORDER — ONDANSETRON HCL 4 MG/2ML IJ SOLN: 4.0000 mg | Freq: Four times a day (QID) | INTRAMUSCULAR | Status: DC | PRN

## 2015-02-24 MED ORDER — ACETAMINOPHEN 650 MG RE SUPP
975.0000 mg | Freq: Once | RECTAL | Status: AC
  Administered 2015-02-24: 975 mg via RECTAL
  Filled 2015-02-24: qty 2

## 2015-02-24 MED ORDER — HYDROCODONE-ACETAMINOPHEN 5-325 MG PO TABS: 1.0000 | ORAL_TABLET | ORAL | Status: DC | PRN

## 2015-02-24 MED ORDER — ONDANSETRON HCL 4 MG PO TABS: 4.0000 mg | ORAL_TABLET | Freq: Four times a day (QID) | ORAL | Status: DC | PRN

## 2015-02-24 MED ORDER — CETYLPYRIDINIUM CHLORIDE 0.05 % MT LIQD
7.0000 mL | Freq: Two times a day (BID) | OROMUCOSAL | Status: DC
  Administered 2015-02-24 – 2015-02-25 (×4): 7 mL via OROMUCOSAL

## 2015-02-24 MED ORDER — SODIUM CHLORIDE 0.9 % IV SOLN
INTRAVENOUS | Status: DC
  Administered 2015-02-24 – 2015-02-25 (×2): via INTRAVENOUS

## 2015-02-24 MED ORDER — PIPERACILLIN-TAZOBACTAM 3.375 G IVPB
3.3750 g | Freq: Three times a day (TID) | INTRAVENOUS | Status: DC
  Administered 2015-02-24 – 2015-02-26 (×6): 3.375 g via INTRAVENOUS
  Filled 2015-02-24 (×8): qty 50

## 2015-02-24 MED ORDER — INSULIN ASPART 100 UNIT/ML ~~LOC~~ SOLN
20.0000 [IU] | Freq: Once | SUBCUTANEOUS | Status: AC
  Administered 2015-02-24: 20 [IU] via INTRAVENOUS
  Filled 2015-02-24: qty 20

## 2015-02-24 MED ORDER — VANCOMYCIN HCL 10 G IV SOLR
1500.0000 mg | Freq: Once | INTRAVENOUS | Status: AC
  Administered 2015-02-24: 1500 mg via INTRAVENOUS
  Filled 2015-02-24: qty 1500

## 2015-02-24 MED ORDER — SODIUM CHLORIDE 0.9 % IV SOLN: 10.0000 mL/h | Freq: Once | INTRAVENOUS | Status: DC

## 2015-02-24 MED ORDER — ACETAMINOPHEN 325 MG PO TABS: 650.0000 mg | ORAL_TABLET | Freq: Four times a day (QID) | ORAL | Status: DC | PRN

## 2015-02-24 MED ORDER — ACETAMINOPHEN 325 MG PO TABS: 650.0000 mg | ORAL_TABLET | ORAL | Status: DC | PRN

## 2015-02-24 MED ORDER — MORPHINE SULFATE (PF) 2 MG/ML IV SOLN
1.0000 mg | INTRAVENOUS | Status: DC | PRN
Start: 2015-02-24 — End: 2015-02-25
  Administered 2015-02-24 (×2): 1 mg via INTRAVENOUS
  Filled 2015-02-24 (×3): qty 1

## 2015-02-24 MED ORDER — PIPERACILLIN-TAZOBACTAM 3.375 G IVPB
3.3750 g | Freq: Once | INTRAVENOUS | Status: DC
  Filled 2015-02-24: qty 50

## 2015-02-24 MED ORDER — VANCOMYCIN HCL 500 MG IV SOLR
500.0000 mg | Freq: Two times a day (BID) | INTRAVENOUS | Status: DC
Start: 2015-02-25 — End: 2015-02-26
  Administered 2015-02-25 – 2015-02-26 (×3): 500 mg via INTRAVENOUS
  Filled 2015-02-24 (×6): qty 500

## 2015-02-24 MED ORDER — INSULIN ASPART 100 UNIT/ML ~~LOC~~ SOLN
0.0000 [IU] | Freq: Three times a day (TID) | SUBCUTANEOUS | Status: DC
  Administered 2015-02-24 – 2015-02-25 (×2): 2 [IU] via SUBCUTANEOUS
  Administered 2015-02-25: 1 [IU] via SUBCUTANEOUS
  Filled 2015-02-24: qty 2
  Filled 2015-02-24: qty 1
  Filled 2015-02-24: qty 2

## 2015-02-24 NOTE — Consult Note (Signed)
ANTIBIOTIC CONSULT NOTE - INITIAL  Pharmacy Consult for vancomycin/zosyn Indication: pneumonia/sepsis  No Known Allergies  Patient Measurements: Height: 5\' 5"  (165.1 cm) Weight: 138 lb (62.596 kg) IBW/kg (Calculated) : 57 Adjusted Body Weight:   Vital Signs: Temp: 100.6 F (38.1 C) (10/17 1030) Temp Source: Rectal (10/17 1030) BP: 126/69 mmHg (10/17 1130) Pulse Rate: 108 (10/17 1130) Intake/Output from previous day:   Intake/Output from this shift:    Labs:  Recent Labs  02/24/15 0907  WBC 238.3*  HGB 7.8*  PLT 36*  CREATININE 1.07*   Estimated Creatinine Clearance: 44.7 mL/min (by C-G formula based on Cr of 1.07). No results for input(s): VANCOTROUGH, VANCOPEAK, VANCORANDOM, GENTTROUGH, GENTPEAK, GENTRANDOM, TOBRATROUGH, TOBRAPEAK, TOBRARND, AMIKACINPEAK, AMIKACINTROU, AMIKACIN in the last 72 hours.   Microbiology: Recent Results (from the past 720 hour(s))  MRSA PCR Screening     Status: None   Collection Time: 01/31/15  7:56 PM  Result Value Ref Range Status   MRSA by PCR NEGATIVE NEGATIVE Final    Comment:        The GeneXpert MRSA Assay (FDA approved for NASAL specimens only), is one component of a comprehensive MRSA colonization surveillance program. It is not intended to diagnose MRSA infection nor to guide or monitor treatment for MRSA infections.   MRSA PCR Screening     Status: None   Collection Time: 02/13/15 11:53 PM  Result Value Ref Range Status   MRSA by PCR NEGATIVE NEGATIVE Final    Comment:        The GeneXpert MRSA Assay (FDA approved for NASAL specimens only), is one component of a comprehensive MRSA colonization surveillance program. It is not intended to diagnose MRSA infection nor to guide or monitor treatment for MRSA infections.     Medical History: Past Medical History  Diagnosis Date  . 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 Hypertension   . Diabetes mellitus without complication ([place])   . Shortness  of breath dyspnea   . History of chemotherapy 2005  . CML (chronic myelocytic leukemia) ([place])   . CML (chronic myelocytic leukemia) ([place])   . Leukemia ([place])   . Leukemia ([facility])     Medications:  Scheduled:  . insulin aspart  0-9 Units Subcutaneous TID WC   Assessment: Pt is a 69 year old female who presents from nursing home with confusion, fever, altered mental status. Chest x-ray shows pneunomia and meets the sepsis criteria. Pharmacy consulted to dose vancomycin and osyn  Goal of Therapy:  Vancomycin trough level 15-20 mcg/ml  Plan:  Measure antibiotic drug levels at steady state Follow up culture results will load with 1500mg  of vancomycin and then start 500mg  q 12 hours. will measure trough at steady state 20 oct at [place]. Will give zosyn 3.375g q 8 hours EI infusion. Pharmacy to continue to monitor. Thank you for the consult   Ramond Dial 02/24/2015,12:29 PM

## 2015-02-24 NOTE — ED Provider Notes (Signed)
Time Seen: Approximately 0 9:30  I have reviewed the triage notes  Chief Complaint: Altered Mental Status   History of Present Illness: Mikayla Keller is a 69 y.o. female who was transported here by EMS for evaluation of confusion. Patient arrives with a low-grade fever which I rectally was 100.6. The patient herself is not able to offer any history or review of systems and has an altered mental status, very confused. The patient's medical record shows a history of chronic myelocytic leukemia. Patient has a small amount of blood coming from the gums. That blood appears old and dried.   Past Medical History  Diagnosis Date  . 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 Hypertension   . Diabetes mellitus without complication ([place])   . Shortness of breath dyspnea   . History of chemotherapy 2005  . CML (chronic myelocytic leukemia) ([place])   . CML (chronic myelocytic leukemia) ([place])   . Leukemia ([place])   . Leukemia [facility])     Patient Active Problem List   Diagnosis Date Noted  . Sepsis ([place]) 02/24/2015  . Symptomatic anemia 02/13/2015  . Pressure ulcer 02/03/2015  . Chronic blood loss anemia 01/31/2015  . Ovarian cancer ([place]) 11/26/2014    Past Surgical History  Procedure Laterality Date  . Abdominal hysterectomy  2005    endometrial cancer  . Ovary surgery Bilateral   . Colonoscopy  2012 ?    Past Surgical History  Procedure Laterality Date  . Abdominal hysterectomy  2005    endometrial cancer  . Ovary surgery Bilateral   . Colonoscopy  2012 ?    No current outpatient prescriptions on file.  Allergies:  Review of patient's allergies indicates no known allergies.  Family History: Family History  Problem Relation Age of Onset  . Cancer Sister     breast ? late 58's  . Cancer Sister      breast mid 41's  . Cancer Cousin     lung late 28's /paternal cousin    Social History: Social History  Substance Use Topics  . Smoking status: Former Smoker    Quit date: 12/06/1958  . Smokeless tobacco: Never Used  . Alcohol Use: No     Review of Systems:   10 point review of systems was performed and was otherwise negative: The patient's review of systems mainly acquired from the medical record, EMS, nursing and bedside evaluation. Constitutional: Fever was discovered here in emergency department Eyes: No visual disturbances ENT: No sore throat, ear pain Cardiac: No chest pain Respiratory: No shortness of breath, wheezing, or stridor Abdomen: No abdominal pain, no vomiting, No diarrhea Endocrine: No weight loss, No night sweats Extremities: No peripheral edema, cyanosis Skin: No rashes, easy bruising Neurologic: No focal weakness, trouble with speech or swollowing Urologic: No dysuria, Hematuria, or urinary frequency  Physical Exam:  ED Triage Vitals  Enc Vitals Group     BP 02/24/15 0900 115/63 mmHg     Pulse Rate 02/24/15 0900 106     Resp 02/24/15 0900 21     Temp 02/24/15 0900 99.8 F (37.7 C)     Temp Source 02/24/15 0900 Axillary     SpO2 02/24/15 0900 93 %     Weight 02/24/15 0900 138 lb (62.596 kg)     Height 02/24/15 0900 5\' 5"  (1.651 m)     Head Cir --      Peak Flow --      Pain Score 02/24/15 0902  0     Pain Loc --      Pain Edu? --      Excl. in [place]? --     General: Awake , Alert , and Oriented times 1; GCS 11 Head: Normal cephalic , atraumatic Eyes: Pupils equal , round, reactive to light Nose/Throat: No nasal drainage, patent upper airway with dry mucous membranes. Dried blood at the gumline.  Neck: Supple, Full range of motion, No anterior adenopathy or palpable thyroid masses Lungs: Clear to ascultation without wheezes , rhonchi, or rales Heart: Tachycardia with a regular rhythm without murmurs , gallops , or rubs Abdomen: Soft, non tender without rebound, guarding , or rigidity; bowel sounds positive and symmetric in all 4 quadrants. No organomegaly .        Extremities: 2 plus symmetric pulses. No  edema, clubbing or cyanosis Neurologic: Motor is 3 out of 5. Patient cooperative. Sensory appears to be intact. Skin: warm, dry, no rashes. Numerous extremity bruises   Labs:   All laboratory work was reviewed including any pertinent negatives or positives listed below:  Labs Reviewed  COMPREHENSIVE METABOLIC PANEL - Abnormal; Notable for the following:    Sodium 129 (*)    Potassium 6.1 (*)    Chloride 100 (*)    CO2 21 (*)    Glucose, Bld 244 (*)    BUN 49 (*)    Creatinine, Ser 1.07 (*)    Total Protein 6.1 (*)    Albumin 2.4 (*)    Total Bilirubin 1.3 (*)    GFR calc non Af Amer 52 (*)    GFR calc Af Amer 60 (*)    All other components within normal limits  CBC - Abnormal; Notable for the following:    WBC 238.3 (*)    RBC 2.00 (*)    Hemoglobin 7.8 (*)    HCT 18.3 (*)    MCV 38.8 (*)    MCH 38.8 (*)    MCHC 42.3 (*)    RDW 24.1 (*)    Platelets 36 (*)    All other components within normal limits  URINALYSIS COMPLETEWITH MICROSCOPIC ([facility] ONLY) - Abnormal; Notable for the following:    Color, Urine AMBER (*)    APPearance CLEAR (*)    All other components within normal limits  GLUCOSE, CAPILLARY - Abnormal; Notable for the following:    Glucose-Capillary 240 (*)    All other components within normal limits  TROPONIN I - Abnormal; Notable for the following:    Troponin I 0.04 (*)    All other components within normal limits  PROTIME-INR - Abnormal; Notable for the following:    Prothrombin Time 19.3 (*)    All other components within normal limits  POTASSIUM - Abnormal; Notable for the following:    Potassium 5.5 (*)    All other components within normal limits  GLUCOSE, CAPILLARY - Abnormal; Notable for the following:    Glucose-Capillary 218 (*)    All other components within normal limits  MRSA PCR SCREENING  CULTURE, BLOOD (ROUTINE X 2)  CULTURE, BLOOD (ROUTINE X 2)  LACTIC ACID, PLASMA  LACTIC ACID, PLASMA  CBC  COMPREHENSIVE METABOLIC PANEL  TYPE  AND SCREEN  PREPARE RBC (CROSSMATCH)  PREPARE PLATELET PHERESIS   multiple laboratory findings were discovered on laboratory work mentioned above.  EKG: ED ECG REPORT I, Daymon Larsen, the attending physician, personally viewed and interpreted this ECG.  Date: 02/24/2015 EKG Time: 0906 Rate: 107 Rhythm: Sinus tachycardia  with occasional PACs QRS Axis: normal Intervals: normal ST/T Wave abnormalities: normal Conduction Disutrbances: none Narrative Interpretation: unremarkable   Radiology:    ULTRASOUND ABDOMEN COMPLETE  COMPARISON: CT abdomen 11/27/2014  FINDINGS: Gallbladder: No cholelithiasis. Small amount of gallbladder sludge. Gallbladder wall thickening measuring 5.8 mm. No pericholecystic fluid. Negative sonographic Murphy sign.  Common bile duct: Diameter: 4.2 mm  Liver: No focal lesion identified. Within normal limits in parenchymal echogenicity.  IVC: No abnormality visualized.  Pancreas: Visualized portion unremarkable.  Spleen: Enlarged without focal abnormality. Splenic volume measures 1399.4 cc.  Right Kidney: Length: 12.8 cm. Trace amount of perinephric fluid. Echogenicity within normal limits. No mass or hydronephrosis visualized.  Left Kidney: Length: 12.9 mm. Echogenicity within normal limits. No mass or hydronephrosis visualized.  Abdominal aorta: No aneurysm visualized.  Other findings: Small right pleural effusion. Enlarged lymph node adjacent to the pancreatic head measuring 6 x 2.1 x 3.7 cm.  IMPRESSION: 1. No cholelithiasis. Small amount dove gallbladder sludge and mild gallbladder wall thickening. Although this appearance can be seen with acalculous cholecystitis, this appearance can also be seen with fluid overload or hepatocellular disease. 2. Masses splenomegaly. 3. Enlarged peripancreatic lymph node.   Electronically Signed By: Kathreen Devoid On: 02/24/2015 15:34          DG Chest Port 1 View (Final result)  Result time: 02/24/15 10:06:20   Final result by Rad Results In Interface (02/24/15 10:06:20)   Narrative:   CLINICAL DATA: Confusion.  EXAM: PORTABLE CHEST 1 VIEW  COMPARISON: 01/31/2015.  FINDINGS: Cardiomegaly with pulmonary venous congestion, interstitial prominence, and right base infiltrate. Findings most consistent congestive heart failure. Right base pneumonia cannot be excluded. Low lung volumes. No pleural effusion or pneumothorax. No acute bony abnormality .  IMPRESSION: 1. Cardiomegaly with mild pulmonary interstitial prominence suggesting congestive heart failure. 2. Right base mild infiltrate. This could represent asymmetric pulmonary edema and/or pneumonia. Low lung volumes.   Electronically Signed By: Marcello Moores Register On: 02/24/2015 10:06          CT Head Wo Contrast (Final result) Result time: 02/24/15 10:16:44   Final result by Rad Results In Interface (02/24/15 10:16:44)   Narrative:   CLINICAL DATA: Increasing confusion and uncontrolled blood sugar coma possible head trauma with blood on patient's teeth  EXAM: CT HEAD WITHOUT CONTRAST  TECHNIQUE: Contiguous axial images were obtained from the base of the skull through the vertex without intravenous contrast.  COMPARISON: 01/31/2015  FINDINGS: Mild to moderate diffuse age-related atrophy. No hydrocephalus. Low attenuation in the deep white matter, age-appropriate. No evidence of mass or vascular territory infarct. No hemorrhage or extra-axial fluid. The calvarium is intact. Mild inflammatory change in the left sphenoid sinus.  IMPRESSION: Age-related involutional change with no acute abnormalities.   Electronically Signed By: Skipper Cliche M.D. On: 02/24/2015 10:16         I personally reviewed the radiologic studies   Critical Care:   CRITICAL CARE Performed by: Daymon Larsen   Total critical care time: 47 minutes  Critical care time was  exclusive of separately billable procedures and treating other patients.  Critical care was necessary to treat or prevent imminent or life-threatening deterioration.  Critical care was time spent personally by me on the following activities: development of treatment plan with patient and/or surrogate as well as nursing, discussions with consultants, evaluation of patient's response to treatment, examination of patient, obtaining history from patient or surrogate, ordering and performing treatments and interventions, ordering and review of laboratory studies, ordering  and review of radiographic studies, pulse oximetry and re-evaluation of patient's condition. Critical care mainly in a dressing multiple blood dyscrasias. Also initiating workup for sepsis    ED Course:  Patient's stay here was uneventful. She was started on broad-spectrum antibiotics and was found to have what appears to be pneumonia on her chest x-ray. Her blood dyscrasias with a significantly elevated white blood cell count, low platelet count and anemia. Patient was initiated on blood transfusion here in emergency department and also fluid resuscitation. The patient was also given a unit of platelets. Patient will require further inpatient resuscitation.    Assessment: * Acute pneumonia Sepsis Anemia Chronic myelogenous leukemia Low platelets    Plan:  Inpatient management I spoke to the hospitalist team, further disposition and management depends upon their evaluation.            Daymon Larsen, MD 02/24/15 [phone]

## 2015-02-24 NOTE — Progress Notes (Signed)
Spoke with Dr. Fritzi Mandes about patient's temperature increased. MD ordered RN to hold giving blood until temperature down in 99's degree F. RN to also give one time 1000 mg tylenol suppository to help bring down fever along with wet washcloths in skin folds.

## 2015-02-24 NOTE — ED Notes (Signed)
MD at bedside. 

## 2015-02-24 NOTE — ED Notes (Signed)
Ems from [facility] for increasing confusion and blood sugar out of control.

## 2015-02-24 NOTE — ED Notes (Signed)
In and out cath done for UA sample. Sterility maintained.  Pt tolerated well.

## 2015-02-24 NOTE — H&P (Signed)
[place] [facility] [place] NAME: Mikayla Keller    MR#:  [id number]  DATE OF BIRTH:  Mar 25, 1946  DATE OF ADMISSION:  02/24/2015  PRIMARY CARE PHYSICIAN: Elton Sin, DO    REQUESTING/REFERRING PHYSICIAN: Dr. Marcelene Butte  CHIEF COMPLAINT:   Altered mental status elevated sugars HISTORY OF PRESENT ILLNESS:  Mikayla Keller  is a 69 y.o. female with a known history of CML on Gleevec, history of anemia and cytopenia chronic, type 2 diabetes, hypertension comes to the emergency room from her skilled facility due to altered mental status and not her usual self and alerted sugars. No family members present in the emergency room. I spoke with patient's niece Mikayla Keller and she was not aware of any issues going on with patient. Patient appears to be confused and not able to give any history or review of system at present. She was found to have temperature 100.6, tachycardia, has some old blood from her gums and appears very confused and restless. She was hypotensive with blood systolic blood pressure in the 90s on arrival to the emergency room. In the ER she was found to have right lower lobe pneumonia and is being admitted for sepsis secondary to pneumonia and severe CML She received Zosyn and IV vancomycin  PAST MEDICAL HISTORY:   Past Medical History  Diagnosis Date  . 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 Hypertension   . Diabetes mellitus without complication ([place])   . Shortness of breath dyspnea   . History of chemotherapy 2005  . CML (chronic myelocytic leukemia) ([place])   . CML (chronic myelocytic leukemia) ([place])   . Leukemia ([place])   . Leukemia ([place])     PAST SURGICAL HISTOIRY:   Past Surgical History  Procedure Laterality Date  . Abdominal hysterectomy  2005    endometrial cancer  . Ovary surgery Bilateral   . Colonoscopy  2012 ?    SOCIAL HISTORY:   Social History  Substance Use Topics  . Smoking status:  Former Smoker    Quit date: 12/06/1958  . Smokeless tobacco: Never Used  . Alcohol Use: No    FAMILY HISTORY:   Family History  Problem Relation Age of Onset  . Cancer Sister     breast ? late 67's  . Cancer Sister      breast mid 30's  . Cancer Cousin     lung late 71's /paternal cousin    DRUG ALLERGIES:  No Known Allergies  REVIEW OF SYSTEMS:  Review of Systems  Unable to perform ROS: mental acuity     MEDICATIONS AT HOME:   Prior to Admission medications   Medication Sig Start Date End Date Taking? Authorizing Provider  acetaminophen (TYLENOL) 325 MG tablet Take 650 mg by mouth every 4 (four) hours as needed for mild pain or moderate pain.   Yes Historical Provider, MD  allopurinol (ZYLOPRIM) 100 MG tablet Take 2 tablets (200 mg total) by mouth daily. 12/25/14  Yes Forest Gleason, MD  atenolol (TENORMIN) 50 MG tablet Take 50 mg by mouth daily.   Yes Historical Provider, MD  glipiZIDE (GLUCOTROL) 10 MG tablet Take 10 mg by mouth 2 (two) times daily.   Yes Historical Provider, MD  lisinopril-hydrochlorothiazide (PRINZIDE,ZESTORETIC) 20-12.5 MG per tablet Take 1 tablet by mouth daily.   Yes Historical Provider, MD  metFORMIN (GLUCOPHAGE) 1000 MG tablet Take 1,000 mg by mouth 2 (two) times daily.    Yes Historical Provider,  MD  Multiple Vitamins-Minerals (MULTIVITAMIN WITH MINERALS) tablet Take 1 tablet by mouth daily.   Yes Historical Provider, MD  pravastatin (PRAVACHOL) 40 MG tablet Take 40 mg by mouth at bedtime.    Yes Historical Provider, MD  predniSONE (DELTASONE) 5 MG tablet Take 1 tablet (5 mg total) by mouth daily with breakfast. Take for 30 days then stop. Patient taking differently: Take 5 mg by mouth daily.  01/08/15  Yes Forest Gleason, MD  promethazine (PHENERGAN) 25 MG tablet Take 25 mg by mouth every 6 (six) hours as needed for nausea or vomiting.   Yes Historical Provider, MD      VITAL SIGNS:  Blood pressure 126/69, pulse 108, temperature 100.6 F (38.1 C),  temperature source Rectal, resp. rate 26, height 5\' 5"  (1.651 m), weight 62.596 kg (138 lb), SpO2 95 %.  PHYSICAL EXAMINATION:  GENERAL:  69 y.o.-year-old patient lying in the bed with mild acute distress. Cachectic frail EYES: Pupils equal, round, reactive to light and accommodation. + scleral icterus. Extraocular muscles intact.  HEENT: Head atraumatic, normocephalic. Oropharynx and nasopharynx clear. Old blood all over her in her mouth secondary to bleeding gums NECK:  Supple, no jugular venous distention. No thyroid enlargement, no tenderness.  LUNGS: Normal breath sounds bilaterally, no wheezing, rales,rhonchi or crepitation. No use of accessory muscles of respiration.  CARDIOVASCULAR: S1, S2 normal. No murmurs, rubs, or gallops.  ABDOMEN: Soft, tendeness in the right upper quadrant, nondistended. Bowel sounds present. No organomegaly or mass.  EXTREMITIES: No pedal edema, cyanosis, or clubbing.  NEUROLOGIC: Unable to assess  PSYCHIATRIC: Unable to assess due to altered mental status SKIN: No obvious rash, lesion, or ulcer.   LABORATORY PANEL:   CBC  Recent Labs Lab 02/24/15 0907  WBC 238.3*  HGB 7.8*  HCT 18.3*  PLT 36*   ------------------------------------------------------------------------------------------------------------------  Chemistries   Recent Labs Lab 02/24/15 0907  NA 129*  K 6.1*  CL 100*  CO2 21*  GLUCOSE 244*  BUN 49*  CREATININE 1.07*  CALCIUM 9.4  AST 32  ALT 18  ALKPHOS 118  BILITOT 1.3*   ------------------------------------------------------------------------------------------------------------------  Cardiac Enzymes  Recent Labs Lab 02/24/15 0907  TROPONINI 0.04*   ------------------------------------------------------------------------------------------------------------------  RADIOLOGY:  Ct Head Wo Contrast  02/24/2015  CLINICAL DATA:  Increasing confusion and uncontrolled blood sugar coma possible head trauma with blood on  patient's teeth EXAM: CT HEAD WITHOUT CONTRAST TECHNIQUE: Contiguous axial images were obtained from the base of the skull through the vertex without intravenous contrast. COMPARISON:  01/31/2015 FINDINGS: Mild to moderate diffuse age-related atrophy. No hydrocephalus. Low attenuation in the deep white matter, age-appropriate. No evidence of mass or vascular territory infarct. No hemorrhage or extra-axial fluid. The calvarium is intact. Mild inflammatory change in the left sphenoid sinus. IMPRESSION: Age-related involutional change with no acute abnormalities. Electronically Signed   By: Skipper Cliche M.D.   On: 02/24/2015 10:16   Dg Chest Port 1 View  02/24/2015  CLINICAL DATA:  Confusion. EXAM: PORTABLE CHEST 1 VIEW COMPARISON:  01/31/2015. FINDINGS: Cardiomegaly with pulmonary venous congestion, interstitial prominence, and right base infiltrate. Findings most consistent congestive heart failure. Right base pneumonia cannot be excluded. Low lung volumes. No pleural effusion or pneumothorax. No acute bony abnormality . IMPRESSION: 1. Cardiomegaly with mild pulmonary interstitial prominence suggesting congestive heart failure. 2. Right base mild infiltrate. This could represent asymmetric pulmonary edema and/or pneumonia. Low lung volumes. Electronically Signed   By: Marcello Moores  Register   On: 02/24/2015 10:06    EKG:  Sinus tachycardia  IMPRESSION AND PLAN:   69 y.o. female with a known history of CML on Gleevec, history of anemia and cytopenia chronic, type 2 diabetes, hypertension comes to the emergency room from her skilled facility due to altered mental status and not her usual self and alerted sugars.  1. Sepsis suspected due to right lower lobe pneumonia Patient was recently here in the hospital will cover her for hospital-acquired infection. Admit to ICU stepdown Continue Zosyn and vancomycin Follow-up blood cultures, follow WBC count Fever, tachycardia, hypotension, elevated white count,  chest x-ray right lower lobe pneumonia gets her to meet sepsis criteria on admission   2. Severe CML patient normal count is around 70K-80K White count today is 283,000 She's currently on IV antibiotics. Oncology consultation placed spell per Dr. Grayland Ormond  3. Severe anemia severe thrombocytopenia acute on chronic ER physician also ordered 1 unit of blood transfusion and 1 unit of platelet She is bleeding actively slowly from her gums  4. Altered mental status/acute encephalopathy suspected due to #1 CT head negative for stroke We'll continue to monitor mental status.  5. Hyperkalemia could be due to dehydration Continue IV fluids IV dextrose and IV insulin Check potassium at 3:00  6. Uncontrolled sugars with history of diabetes2 Keep patient on sliding scale insulin patient is nothing by mouth at present  If she continues to have elevated sugars will consider starting insulin drip   Patient is critically ill at present. Spoke with her niece Mikayla Keller [phone] Mikayla Keller son [phone]  Al  the records are reviewed and case discussed with ED provider. Management plans discussed with the patient, family and they are in agreement.  CODE STATUS: Full  TOTAL CRITICAL TIME TAKING CARE OF THIS PATIENT: 55 minutes.    Josemaria Brining M.D on 02/24/2015 at 12:10 PM  Between 7am to 6pm - Pager - [phone]  After 6pm go to [email or web address] - password [email or web address] [facility]  Office  [phone]  CC: Primary care physician; Elton Sin, DO

## 2015-02-24 NOTE — ED Notes (Signed)
Lab called with troponin value of 0.04, Dr.Quigley aware

## 2015-02-24 NOTE — Progress Notes (Signed)
Confirmed with MD on call, Dr. Vianne Bulls, no new orders for serum potassium value of 5.5.

## 2015-02-25 DIAGNOSIS — C9210 Chronic myeloid leukemia, BCR/ABL-positive, not having achieved remission: Secondary | ICD-10-CM

## 2015-02-25 DIAGNOSIS — R634 Abnormal weight loss: Secondary | ICD-10-CM

## 2015-02-25 DIAGNOSIS — R4182 Altered mental status, unspecified: Secondary | ICD-10-CM

## 2015-02-25 DIAGNOSIS — Z87891 Personal history of nicotine dependence: Secondary | ICD-10-CM

## 2015-02-25 DIAGNOSIS — R1310 Dysphagia, unspecified: Secondary | ICD-10-CM

## 2015-02-25 DIAGNOSIS — D649 Anemia, unspecified: Secondary | ICD-10-CM

## 2015-02-25 DIAGNOSIS — D696 Thrombocytopenia, unspecified: Secondary | ICD-10-CM

## 2015-02-25 LAB — GLUCOSE, CAPILLARY
GLUCOSE-CAPILLARY: 157 mg/dL — AB (ref 65–99)
GLUCOSE-CAPILLARY: 144 mg/dL — AB (ref 65–99)
GLUCOSE-CAPILLARY: 177 mg/dL — AB (ref 65–99)
Glucose-Capillary: 165 mg/dL — ABNORMAL HIGH (ref 65–99)

## 2015-02-25 LAB — PREPARE PLATELET PHERESIS: UNIT DIVISION: 0

## 2015-02-25 LAB — CBC
HCT: 28.5 % — ABNORMAL LOW (ref 36.0–46.0)
HCT: 28.2 % — ABNORMAL LOW (ref 35.0–47.0)
HEMOGLOBIN: 7.8 g/dL — AB (ref 12.0–16.0)
HEMOGLOBIN: 7.7 g/dL — AB (ref 12.0–16.0)
MCH: 38.8 pg — ABNORMAL HIGH (ref 26.0–34.0)
MCH: 40 pg — AB (ref 26.0–34.0)
MCHC: 27.4 g/dL — ABNORMAL LOW (ref 30.0–36.0)
MCHC: 27.3 g/dL — ABNORMAL LOW (ref 32.0–36.0)
MCV: 91.7 fL (ref 78.0–100.0)
MCV: 90.7 fL (ref 80.0–100.0)
Platelets: 36 10*3/uL — ABNORMAL LOW (ref 150–440)
Platelets: 19 10*3/uL — CL (ref 150–440)
RBC: 2 MIL/uL — AB (ref 3.80–5.20)
RBC: 1.92 MIL/uL — AB (ref 3.80–5.20)
RDW: 24.1 % — ABNORMAL HIGH (ref 11.5–14.5)
RDW: 23.3 % — ABNORMAL HIGH (ref 11.5–14.5)
WBC: 238.3 10*3/uL (ref 3.6–11.0)
WBC: 233.4 10*3/uL — AB (ref 3.6–11.0)

## 2015-02-25 LAB — COMPREHENSIVE METABOLIC PANEL
ALT: 14 U/L (ref 14–54)
AST: 26 U/L (ref 15–41)
Albumin: 2.3 g/dL — ABNORMAL LOW (ref 3.5–5.0)
Alkaline Phosphatase: 119 U/L (ref 38–126)
Anion gap: 11 (ref 5–15)
BUN: 50 mg/dL — AB (ref 6–20)
CHLORIDE: 107 mmol/L (ref 101–111)
CO2: 18 mmol/L — AB (ref 22–32)
Calcium: 9 mg/dL (ref 8.9–10.3)
Creatinine, Ser: 1.19 mg/dL — ABNORMAL HIGH (ref 0.44–1.00)
GFR, EST AFRICAN AMERICAN: 53 mL/min — AB (ref >60)
GFR, EST NON AFRICAN AMERICAN: 45 mL/min — AB (ref >60)
Glucose, Bld: 176 mg/dL — ABNORMAL HIGH (ref 65–99)
POTASSIUM: 3.6 mmol/L (ref 3.5–5.1)
Sodium: 136 mmol/L (ref 135–145)
Total Bilirubin: 1.6 mg/dL — ABNORMAL HIGH (ref 0.3–1.2)
Total Protein: 5.6 g/dL — ABNORMAL LOW (ref 6.5–8.1)

## 2015-02-25 MED ORDER — MORPHINE SULFATE (PF) 2 MG/ML IV SOLN
1.0000 mg | INTRAVENOUS | Status: DC | PRN
  Administered 2015-02-25 – 2015-02-26 (×5): 1 mg via INTRAVENOUS
  Filled 2015-02-25 (×5): qty 1

## 2015-02-25 MED ORDER — IMATINIB MESYLATE 400 MG PO TABS
400.0000 mg | ORAL_TABLET | Freq: Every day | ORAL | Status: DC
  Filled 2015-02-25: qty 1

## 2015-02-25 MED ORDER — INSULIN ASPART 100 UNIT/ML ~~LOC~~ SOLN: 0.0000 [IU] | Freq: Three times a day (TID) | SUBCUTANEOUS | Status: DC

## 2015-02-25 MED ORDER — IMATINIB MESYLATE 100 MG PO TABS
400.0000 mg | ORAL_TABLET | Freq: Every day | ORAL | Status: DC
  Filled 2015-02-25: qty 1

## 2015-02-25 NOTE — Progress Notes (Signed)
Spoke with MD on the phone about patient's critical platelet 19 (down from yesterday's 36 even after 1 unit of platelets) and WBC 233.4 (down from 238.3) and hemoglobin at 7.7 (down slightly from 7.8). MD to place order for platelets. RN confirmed no signs of bleeding overnight or at this time and oncology consult called yesterday to cancer center given patient's history.

## 2015-02-25 NOTE — Progress Notes (Signed)
Nutrition Brief Note  Chart reviewed secondary to Malnutrition Screening Tool score of 2.  Pt now transitioning to comfort care.  No further nutrition interventions warranted at this time.  Please re-consult as needed.   Aislynn Cifelli B. Zenia Resides, [place], [place] (pager)

## 2015-02-25 NOTE — Progress Notes (Signed)
[facility] [place] NAME: Mikayla Keller    MR#:  [id number]  DATE OF BIRTH:  1946/04/21  SUBJECTIVE:  CHIEF COMPLAINT:   Chief Complaint  Patient presents with  . Altered Mental Status   Patient is 69 year old African-American female with history of CML who presents to the hospital with complaints of altered mental status. On arrival to the hospital patient was noted to be confused, not able to provide review of systems. She was noted to be febrile with temperature 100.6. Tachycardic. She also noted to have some bleeding from her gums. She was hypotensive. A shin. Chest x-ray revealed right base infiltrate, and patient was initiated on vancomycin and Zosyn, patient's labs showed the renal insufficiency and elevated total bili been as well as markedly elevated white blood cell count with severe anemia. No gastrointestinal bleeding was noted, patient's platelet count remains very low at 19,000 today. Patient remains poorly responsive and confused, nonverbal and not able to follow commands.  Review of Systems  Unable to perform ROS: mental status change    VITAL SIGNS: Blood pressure 109/54, pulse 116, temperature 98.7 F (37.1 C), temperature source Axillary, resp. rate 28, height 5\' 5"  (1.651 m), weight 62.1 kg (136 lb 14.5 oz), SpO2 95 %.  PHYSICAL EXAMINATION:   GENERAL:  69 y.o.-year-old patient lying in the bed in the mild-to-moderate distress. restless,, moving in the bed continuously EYES: Pupils equal, round, reactive to light and accommodation. No scleral icterus. Extraocular muscles intact. Dry oral mucosa. Poor dentition HEENT: Head atraumatic, normocephalic. Oropharynx and nasopharynx clear.  NECK:  Supple, no jugular venous distention. No thyroid enlargement, no tenderness.  LUNGS: Normal breath sounds bilaterally, no wheezing, rales,rhonchi or crepitation. No use of accessory muscles of respiration. Diminished at  bases CARDIOVASCULAR: S1, S2 normal. No murmurs, rubs, or gallops.  ABDOMEN: Soft, minimally tender diffusely seemed to be more guarding in the right upper quadrant, nondistended. Bowel sounds present. No organomegaly or mass.  EXTREMITIES: No pedal edema, cyanosis, or clubbing.  NEUROLOGIC: Cranial nerves II through XII are intact. Muscle strength , able to move all extremities. Sensation not able to assess. Gait not checked. Patient is nonverbal, not following commands PSYCHIATRIC: The patient is alert and oriented x 3.  SKIN: No obvious rash, lesion, or ulcer.   ORDERS/RESULTS REVIEWED:   CBC  Recent Labs Lab 02/24/15 0907 02/25/15 0541  WBC 238.3* 233.4*  HGB 7.8* 7.7*  HCT 28.5* 28.2*  PLT 36* 19*  MCV 91.7 90.7  MCH 38.8* 40.0*  MCHC 27.4* 27.3*  RDW 24.1* 23.3*   ------------------------------------------------------------------------------------------------------------------  Chemistries   Recent Labs Lab 02/24/15 0907 02/24/15 1356 02/25/15 0541  NA 129*  --  136  K 6.1* 5.5* 3.6  CL 100*  --  107  CO2 21*  --  18*  GLUCOSE 244*  --  176*  BUN 49*  --  50*  CREATININE 1.07*  --  1.19*  CALCIUM 9.4  --  9.0  AST 32  --  26  ALT 18  --  14  ALKPHOS 118  --  119  BILITOT 1.3*  --  1.6*   ------------------------------------------------------------------------------------------------------------------ estimated creatinine clearance is 40.1 mL/min (by C-G formula based on Cr of 1.19). ------------------------------------------------------------------------------------------------------------------ No results for input(s): TSH, T4TOTAL, T3FREE, THYROIDAB in the last 72 hours.  Invalid input(s): FREET3  Cardiac Enzymes  Recent Labs Lab 02/24/15 0907  TROPONINI 0.04*   ------------------------------------------------------------------------------------------------------------------ Invalid input(s):  POCBNP ---------------------------------------------------------------------------------------------------------------  RADIOLOGY:  Ct Head Wo Contrast  02/24/2015  CLINICAL DATA:  Increasing confusion and uncontrolled blood sugar coma possible head trauma with blood on patient's teeth EXAM: CT HEAD WITHOUT CONTRAST TECHNIQUE: Contiguous axial images were obtained from the base of the skull through the vertex without intravenous contrast. COMPARISON:  01/31/2015 FINDINGS: Mild to moderate diffuse age-related atrophy. No hydrocephalus. Low attenuation in the deep white matter, age-appropriate. No evidence of mass or vascular territory infarct. No hemorrhage or extra-axial fluid. The calvarium is intact. Mild inflammatory change in the left sphenoid sinus. IMPRESSION: Age-related involutional change with no acute abnormalities. Electronically Signed   By: Skipper Cliche M.D.   On: 02/24/2015 10:16   US Abdomen Complete  02/24/2015  CLINICAL DATA:  Right upper quadrant pain EXAM: ULTRASOUND ABDOMEN COMPLETE COMPARISON:  CT abdomen 11/27/2014 FINDINGS: Gallbladder: No cholelithiasis. Small amount of gallbladder sludge. Gallbladder wall thickening measuring 5.8 mm. No pericholecystic fluid. Negative sonographic Murphy sign. Common bile duct: Diameter: 4.2 mm Liver: No focal lesion identified. Within normal limits in parenchymal echogenicity. IVC: No abnormality visualized. Pancreas: Visualized portion unremarkable. Spleen: Enlarged without focal abnormality. Splenic volume measures 1399.4 cc. Right Kidney: Length: 12.8 cm. Trace amount of perinephric fluid. Echogenicity within normal limits. No mass or hydronephrosis visualized. Left Kidney: Length: 12.9 mm. Echogenicity within normal limits. No mass or hydronephrosis visualized. Abdominal aorta: No aneurysm visualized. Other findings: Small right pleural effusion. Enlarged lymph node adjacent to the pancreatic head measuring 6 x 2.1 x 3.7 cm. IMPRESSION: 1. No  cholelithiasis. Small amount dove gallbladder sludge and mild gallbladder wall thickening. Although this appearance can be seen with acalculous cholecystitis, this appearance can also be seen with fluid overload or hepatocellular disease. 2. Masses splenomegaly. 3. Enlarged peripancreatic lymph node. Electronically Signed   By: Kathreen Devoid   On: 02/24/2015 15:34   Dg Chest Port 1 View  02/24/2015  CLINICAL DATA:  Confusion. EXAM: PORTABLE CHEST 1 VIEW COMPARISON:  01/31/2015. FINDINGS: Cardiomegaly with pulmonary venous congestion, interstitial prominence, and right base infiltrate. Findings most consistent congestive heart failure. Right base pneumonia cannot be excluded. Low lung volumes. No pleural effusion or pneumothorax. No acute bony abnormality . IMPRESSION: 1. Cardiomegaly with mild pulmonary interstitial prominence suggesting congestive heart failure. 2. Right base mild infiltrate. This could represent asymmetric pulmonary edema and/or pneumonia. Low lung volumes. Electronically Signed   By: Marcello Moores  Register   On: 02/24/2015 10:06    EKG:  Orders placed or performed during the hospital encounter of 02/24/15  . ED EKG  . ED EKG    ASSESSMENT AND PLAN:  Active Problems:   Sepsis ([place]) 1. Sepsis due to bacterial pneumonia of unclear etiology agent at present, continue broad-spectrum antibiotic coverage with vancomycin as well as Zosyn as well as IV fluids. Blood cultures are taken and so far are negative 2. Bacterial pneumonia, unclear etiologic agent, continue broad-spectrum antibiotic therapy and likely will be able to get sputum cultures. Due to patient's poor overall condition 3. Metabolic encephalopathy, supportive therapy only,. Patient's family had that discussion with Dr. Donnetta Hail and decided on comfort care. We will be asking palliative care to be involved for management of this patient 4. Renal insufficiency seemed to be stable 5. Hypotension. Continue IV fluids at present.  Patient's blood pressure seemed to be stable as well with IV fluids 6. Tachycardia, likely due to sepsis, unable to use beta blockers or calcium channel blockers due to hypotension, supportive therapy 7. CML. No platelet, or red blood cell transfusion per oncology's  recommendations following closely, continue Gleevec 8. Diabetes mellitus. Continue sliding scale insulin. Patient is nothing by mouth due to her altered mental status at present Management plans discussed with the patient, family and they are in agreement.   DRUG ALLERGIES: No Known Allergies  CODE STATUS:     Code Status Orders        Start     Ordered   02/24/15 1313  Full code   Continuous     02/24/15 1312      TOTAL CRITICAL CARE TIME TAKING CARE OF THIS PATIENT: 55 minutes.  Discussed this family and supportive therapy provided, discussed with oncologist , coordination of care. 59 minutes  Ashlyne Olenick M.D on 02/25/2015 at 1:23 PM  Between 7am to 6pm - Pager - [phone]  After 6pm go to [email or web address] - password [email or web address] [facility]  Office  [phone]  CC: Primary care physician; Elton Sin, DO

## 2015-02-25 NOTE — Clinical Documentation Improvement (Signed)
Internal Medicine  Can the diagnosis of anemia be further specified? Please update your documentation within the medical record to reflect your response to this query. Thank you   Acute Blood Loss Anemia  Chronic Blood Loss Anemia  Acute on Chronic Blood Loss Anemia  Neoplastic Anemia  Other  Clinically Undetermined  Document any associated diagnoses/conditions. Supporting Information:  Has CML  Received 1 unit of PRBC's along with 1 unit Platelets  Please exercise your independent, professional judgment when responding. A specific answer is not anticipated or expected.  Thank You,  Zoila Shutter BSN, [place] [phone]

## 2015-02-25 NOTE — Progress Notes (Signed)
Report called to [facility] on 1C. Patient to be moved to room 1C. Message left with son about room transfer.

## 2015-02-25 NOTE — Consult Note (Addendum)
Pharmacy Consult for vancomycin/zosyn Indication: pneumonia/sepsis  No Known Allergies  Patient Measurements: Height: 5\' 5"  (165.1 cm) Weight: 136 lb 14.5 oz (62.1 kg) IBW/kg (Calculated) : 57 Adjusted Body Weight:   Vital Signs: Temp: 98.2 F (36.8 C) (10/18 0730) Temp Source: Oral (10/18 0730) BP: 90/52 mmHg (10/18 1000) Pulse Rate: 105 (10/18 1000) Intake/Output from previous day: 10/17 0701 - 10/18 0700 In: 2243.7 [I.V.:1495; Blood:498.7; IV Piggyback:250] Out: -  Intake/Output from this shift: Total I/O In: 200 [I.V.:200] Out: -   Labs:  Recent Labs  02/24/15 0907 02/25/15 0541  WBC 238.3* 233.4*  HGB 7.8* 7.7*  PLT 36* 19*  CREATININE 1.07* 1.19*   Estimated Creatinine Clearance: 40.1 mL/min (by C-G formula based on Cr of 1.19). No results for input(s): VANCOTROUGH, VANCOPEAK, VANCORANDOM, GENTTROUGH, GENTPEAK, GENTRANDOM, TOBRATROUGH, TOBRAPEAK, TOBRARND, AMIKACINPEAK, AMIKACINTROU, AMIKACIN in the last 72 hours.   Microbiology: Recent Results (from the past 720 hour(s))  MRSA PCR Screening     Status: None   Collection Time: 01/31/15  7:56 PM  Result Value Ref Range Status   MRSA by PCR NEGATIVE NEGATIVE Final    Comment:        The GeneXpert MRSA Assay (FDA approved for NASAL specimens only), is one component of a comprehensive MRSA colonization surveillance program. It is not intended to diagnose MRSA infection nor to guide or monitor treatment for MRSA infections.   MRSA PCR Screening     Status: None   Collection Time: 02/13/15 11:53 PM  Result Value Ref Range Status   MRSA by PCR NEGATIVE NEGATIVE Final    Comment:        The GeneXpert MRSA Assay (FDA approved for NASAL specimens only), is one component of a comprehensive MRSA colonization surveillance program. It is not intended to diagnose MRSA infection nor to guide or monitor treatment for MRSA infections.   Culture, blood (routine x 2)     Status: None (Preliminary result)   Collection Time: 02/24/15 10:26 AM  Result Value Ref Range Status   Specimen Description BLOOD LEFT HAND  Final   Special Requests BOTTLES DRAWN AEROBIC AND ANAEROBIC  1 CC  Final   Culture NO GROWTH < 24 HOURS  Final   Report Status PENDING  Incomplete  Culture, blood (routine x 2)     Status: None (Preliminary result)   Collection Time: 02/24/15 10:34 AM  Result Value Ref Range Status   Specimen Description BLOOD RIGHT HAND  Final   Special Requests   Final    BOTTLES DRAWN AEROBIC AND ANAEROBIC  1CC ANERO 2 CC AERO   Culture NO GROWTH < 24 HOURS  Final   Report Status PENDING  Incomplete  MRSA PCR Screening     Status: None   Collection Time: 02/24/15  1:10 PM  Result Value Ref Range Status   MRSA by PCR NEGATIVE NEGATIVE Final    Comment:        The GeneXpert MRSA Assay (FDA approved for NASAL specimens only), is one component of a comprehensive MRSA colonization surveillance program. It is not intended to diagnose MRSA infection nor to guide or monitor treatment for MRSA infections.     Medical History: Past Medical History  Diagnosis Date  . 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 Hypertension   . Diabetes mellitus without complication ([place])   . Shortness of breath dyspnea   . History of chemotherapy 2005  . CML (chronic myelocytic leukemia) ([place])   .  CML (chronic myelocytic leukemia) ([place])   . Leukemia ([place])   . Leukemia ([place])     Medications:  Scheduled:  . antiseptic oral rinse  7 mL Mouth Rinse q12n4p  . chlorhexidine  15 mL Mouth Rinse BID  . imatinib  400 mg Oral Q breakfast  . insulin aspart  0-9 Units Subcutaneous TID WC  . piperacillin-tazobactam (ZOSYN)  IV  3.375 g Intravenous 3 times per day  . vancomycin  500 mg Intravenous Q12H   Assessment: 69 y/o F with a h/o CML admitted with sepsis due to RLL PNA.   Goal of Therapy:  Vancomycin trough level 15-20 mcg/ml  Plan:  Continue Zosyn 3.375 g EI q 8 hours.  Will continue  vancomycin 500 mg iv q 12 hours and plan on checking a trough with the 6th dose (7th total dose) which should be closer to steady state (t1/2 abouit 18 hours). Will continue to follow renal function and culture results.    Ulice Dash D 02/25/2015,11:32 AM

## 2015-02-25 NOTE — Consult Note (Signed)
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

## 2015-02-25 NOTE — Care Management Note (Signed)
Case Management Note  Patient Details  Name: Mikayla Keller MRN: [id number] Date of Birth: May 17, 1945  Subjective/Objective:   Patient from [facility]. CSW updated. Admitted with altered mental status secondary to sepsis and PNA, severe CML and anemia. Recently at [facility] 10/6-10/7 for CML and anemia. Oncology is to speak with family regarding goals of care regarding progression of CML.             Action/Plan:   Expected Discharge Date:                  Expected Discharge Plan:  Skilled Nursing Facility  In-House Referral:  Clinical Social Work  Discharge planning Services  CM Consult  Post Acute Care Choice:    Choice offered to:     DME Arranged:    DME Agency:     HH Arranged:    [place] Agency:     Status of Service:  In process, will continue to follow  Medicare Important Message Given:    Date Medicare IM Given:    Medicare IM give by:    Date Additional Medicare IM Given:    Additional Medicare Important Message give by:     If discussed at [place] of Stay Meetings, dates discussed:    Additional Comments:  Jolly Mango, RN 02/25/2015, 10:57 AM

## 2015-02-25 NOTE — Consult Note (Signed)
Mikayla Keller @ [facility] Telephone:(336) [phone]  Fax:(336) [phone]   INITIAL CONSULT  Mikayla Keller OB: 1946/05/09  MR#: [id number]  OVF#:[id number]  Patient Care Team: Elton Sin, DO as PCP - General (Internal Medicine) Christene Lye, MD as Surgeon (General Surgery) Forest Gleason, MD (Oncology)  CHIEF COMPLAINT:  Chief Complaint  Patient presents with  . Altered Mental Status  1.  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  History of endometrial cancer status post chemotherapy or therapy  VISIT DIAGNOSIS:     ICD-9-CM ICD-10-CM   1. Other pancytopenia ([place]) 284.19 D61.818   2. Right upper quadrant pain 789.01 R10.11 US Abdomen Complete     US Abdomen Complete  3. 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  Patient presented with leukocytosis, anemia and thrombocytopenia     Ovarian cancer [facility])    Oncology Flowsheet 02/01/2015 02/02/2015 02/03/2015 02/14/2015  predniSONE (DELTASONE) PO 5 mg 5 mg 5 mg 5 mg    INTERVAL HISTORY:  69 year old lady was been diagnosed to have chronic myelogenous leukemia was last admitted in the hospital on December 7.  Was discharged to the rehabilitation facility.  Not sure whether patient received Gleevec therapy or not.  Recently was brought to the intensive care unit with altered mental status.  Thrombocytopenia and anemia. Was started on IV antibiotics.  Patient's condition is declining rapidly.  Poorly responsive. I was asked to evaluate this patient Most of the history has been obtained from nurses and review of the chart  REVIEW OF SYSTEMS:   Patient is poorly  responsive.  Not able to drink or swallow any medications. Has lost significant weight.  Had fever.  Hypotension . Losing weight  Mild nutrition due to inability to eat and poor appetite  Review of all other system has been reported to be negative  As per HPI. Otherwise, a complete review of systems is negatve.  PAST MEDICAL HISTORY: Past Medical History  Diagnosis Date  . Ovarian cancer ([place]) 2005    endometrial cacner  . Hypertension   . Diabetes mellitus without complication ([place])   . Shortness of breath dyspnea   . History of chemotherapy 2005  . CML (chronic myelocytic leukemia) ([place])   . CML (chronic myelocytic leukemia) ([place])   . Leukemia ([place])   . Leukemia ([place])     PAST SURGICAL HISTORY: Past Surgical History  Procedure Laterality Date  . Abdominal hysterectomy  2005    endometrial cancer  . Ovary surgery Bilateral   . Colonoscopy  2012 ?    FAMILY HISTORY Family History  Problem Relation Age of Onset  . Cancer Sister     breast ? late 56's  . Cancer Sister      breast mid 98's  . Cancer Cousin     lung late 69's /paternal cousin        ADVANCED DIRECTIVES:   Patient does not have any living will or healthcare power of attorney.  Information was given .  Available  resources had been discussed.  We will follow-up on subsequent appointments regarding this issue HEALTH MAINTENANCE: Social History  Substance Use Topics  . Smoking status: Former Smoker    Quit date: 12/06/1958  . Smokeless tobacco: Never Used  . Alcohol Use: No      No Known Allergies  Current Facility-Administered Medications  Medication Dose Route Frequency Provider Last Rate Last Dose  . 0.9 %  sodium chloride infusion   Intravenous Continuous Fritzi Mandes, MD 100 mL/hr at 02/24/15 1409    . acetaminophen (TYLENOL) tablet 650 mg  650 mg Oral Q6H PRN Fritzi Mandes, MD       Or  . acetaminophen (TYLENOL) suppository 650 mg  650 mg Rectal Q6H PRN Fritzi Mandes, MD      . acetaminophen  (TYLENOL) tablet 650 mg  650 mg Oral Q4H PRN Fritzi Mandes, MD      . antiseptic oral rinse (CPC / CETYLPYRIDINIUM CHLORIDE 0.05%) solution 7 mL  7 mL Mouth Rinse q12n4p Fritzi Mandes, MD   7 mL at 02/25/15 1200  . chlorhexidine (PERIDEX) 0.12 % solution 15 mL  15 mL Mouth Rinse BID Fritzi Mandes, MD   15 mL at 02/25/15 0921  . HYDROcodone-acetaminophen (NORCO/VICODIN) 5-325 MG per tablet 1-2 tablet  1-2 tablet Oral Q4H PRN Fritzi Mandes, MD      . imatinib (GLEEVEC) tablet 400 mg  400 mg Oral Q breakfast Forest Gleason, MD   400 mg at 02/25/15 0800  . insulin aspart (novoLOG) injection 0-9 Units  0-9 Units Subcutaneous TID WC Fritzi Mandes, MD   1 Units at 02/25/15 1246  . morphine 2 MG/ML injection 1 mg  1 mg Intravenous Q2H PRN Theodoro Grist, MD      . ondansetron (ZOFRAN) tablet 4 mg  4 mg Oral Q6H PRN Fritzi Mandes, MD       Or  . ondansetron (ZOFRAN) injection 4 mg  4 mg Intravenous Q6H PRN Fritzi Mandes, MD      . piperacillin-tazobactam (ZOSYN) IVPB 3.375 g  3.375 g Intravenous 3 times per day Fritzi Mandes, MD   3.375 g at 02/25/15 0535  . vancomycin (VANCOCIN) 500 mg in sodium chloride 0.9 % 100 mL IVPB  500 mg Intravenous Q12H Fritzi Mandes, MD   500 mg at 02/25/15 0126    OBJECTIVE: PHYSICAL EXAM: Gen. status: Cachectic patient lying in the bed poorly responsive. Pale looking sclera. Signs of dehydration. Lungs: Diminished and entry on both sides Heart tachycardia Lower extremity trace edema Abdomen soft.  Tenderness present.  Poor bowel sounds Neurological system: Patient is unresponsive.  No other localizing sign Skin: Poor skin turgor Lymph nodes not palpable   Filed Vitals:   02/25/15 1230  BP: 109/54  Pulse: 116  Temp: 98.7 F (37.1 C)  Resp: 28     Body mass index is 22.78 kg/(m^2).    ECOG FS:3 - Symptomatic, >50% confined to bed  LAB RESULTS:  Admission on 02/24/2015  Component Date Value Ref Range Status  . Sodium 02/24/2015 129* 135 - 145 mmol/L Final  . Potassium 02/24/2015 6.1*  3.5 - 5.1 mmol/L Final  . Chloride 02/24/2015 100* 101 - 111 mmol/L Final  . CO2 02/24/2015 21* 22 - 32 mmol/L Final  . Glucose, Bld 02/24/2015 244* 65 - 99 mg/dL Final  . BUN 02/24/2015 49* 6 - 20 mg/dL Final  . Creatinine, Ser 02/24/2015 1.07* 0.44 - 1.00 mg/dL Final  . Calcium 02/24/2015 9.4  8.9 - 10.3 mg/dL Final  .  Total Protein 02/24/2015 6.1* 6.5 - 8.1 g/dL Final  . Albumin 02/24/2015 2.4* 3.5 - 5.0 g/dL Final  . AST 02/24/2015 32  15 - 41 U/L Final  . ALT 02/24/2015 18  14 - 54 U/L Final  . Alkaline Phosphatase 02/24/2015 118  38 - 126 U/L Final  . Total Bilirubin 02/24/2015 1.3* 0.3 - 1.2 mg/dL Final  . GFR calc non Af Amer 02/24/2015 52* >60 mL/min Final  . GFR calc Af Amer 02/24/2015 60* >60 mL/min Final   Comment: (NOTE) The eGFR has been calculated using the CKD EPI equation. This calculation has not been validated in all clinical situations. eGFR's persistently <60 mL/min signify possible Chronic Kidney Disease.   . Anion gap 02/24/2015 8  5 - 15 Final  . WBC 02/24/2015 238.3* 3.6 - 11.0 K/uL Final   Comment: CRITICAL RESULT CALLED TO, READ BACK BY AND VERIFIED WITH: Valerie Chandlier 1007 BOD on 02/24/15   . RBC 02/24/2015 2.00* 3.80 - 5.20 MIL/uL Final  . Hemoglobin 02/24/2015 7.8* 12.0 - 16.0 g/dL Final  . HCT 02/24/2015 28.5* 36.0 - 46.0 % Corrected   Comment: SPUN HCT NOTIFIED SARAH MOORE ON 02/25/15 AT 0708 BY QSD CORRECTED ON 10/18 AT 7106: PREVIOUSLY REPORTED AS 18.3   . MCV 02/24/2015 91.7  78.0 - 100.0 fL Corrected   Comment: NOTIFIED [place] ON 02/25/15 AT 0708 BY QSD CORRECTED ON 10/18 AT 2694: PREVIOUSLY REPORTED AS 38.8   . MCH 02/24/2015 38.8* 26.0 - 34.0 pg Final  . MCHC 02/24/2015 27.4* 30.0 - 36.0 g/dL Corrected   Comment: NOTIFIED [place] ON 02/25/15 AT 0708 BY QSD CORRECTED ON 10/18 AT 8546: PREVIOUSLY REPORTED AS 42.3 Did not correct after warming   . RDW 02/24/2015 24.1* 11.5 - 14.5 % Final  . Platelets 02/24/2015 36* 150 - 440  K/uL Final   PLATELET COUNT CONFIRMED BY SMEAR  . Color, Urine 02/24/2015 AMBER* YELLOW Final  . APPearance 02/24/2015 CLEAR* CLEAR Final  . Glucose, UA 02/24/2015 NEGATIVE  NEGATIVE mg/dL Final  . Bilirubin Urine 02/24/2015 NEGATIVE  NEGATIVE Final  . Ketones, ur 02/24/2015 NEGATIVE  NEGATIVE mg/dL Final  . Specific Gravity, Urine 02/24/2015 1.006  1.005 - 1.030 Final  . Hgb urine dipstick 02/24/2015 NEGATIVE  NEGATIVE Final  . pH 02/24/2015 7.0  5.0 - 8.0 Final  . Protein, ur 02/24/2015 NEGATIVE  NEGATIVE mg/dL Final  . Nitrite 02/24/2015 NEGATIVE  NEGATIVE Final  . Leukocytes, UA 02/24/2015 NEGATIVE  NEGATIVE Final  . RBC / HPF 02/24/2015 0-5  0 - 5 RBC/hpf Final  . WBC, UA 02/24/2015 0-5  0 - 5 WBC/hpf Final  . Bacteria, UA 02/24/2015 NONE SEEN  NONE SEEN Final  . Squamous Epithelial / LPF 02/24/2015 NONE SEEN  NONE SEEN Final  . Mucous 02/24/2015 PRESENT   Final  . Glucose-Capillary 02/24/2015 240* 65 - 99 mg/dL Final  . ABO/RH(D) 02/24/2015 O POS   Final  . Antibody Screen 02/24/2015 NEG   Final  . Sample Expiration 02/24/2015 March 27, 2015   Final  . Unit Number 02/24/2015 [id number]   Final  . Blood Component Type 02/24/2015 RBC, LR IRR   Final  . Unit division 02/24/2015 00   Final  . Status of Unit 02/24/2015 REL FROM [facility]   Final  . Transfusion Status 02/24/2015 OK TO TRANSFUSE   Final  . Crossmatch Result 02/24/2015 Compatible   Final  . Unit Number 02/24/2015 [id number]   Final  . Blood Component Type 02/24/2015 RBC, LR  IRR   Final  . Unit division 02/24/2015 00   Final  . Status of Unit 02/24/2015 REL FROM [facility]   Final  . Transfusion Status 02/24/2015 OK TO TRANSFUSE   Final  . Crossmatch Result 02/24/2015 Compatible   Final  . Unit Number 02/24/2015 [id number]   Final  . Blood Component Type 02/24/2015 RED CELLS,LR   Final  . Unit division 02/24/2015 00   Final  . Status of Unit 02/24/2015 ALLOCATED   Final  . Transfusion Status 02/24/2015 OK TO TRANSFUSE    Final  . Crossmatch Result 02/24/2015 Compatible   Final  . Unit Number 02/24/2015 [id number]   Final  . Blood Component Type 02/24/2015 RED CELLS,LR   Final  . Unit division 02/24/2015 00   Final  . Status of Unit 02/24/2015 ALLOCATED   Final  . Transfusion Status 02/24/2015 OK TO TRANSFUSE   Final  . Crossmatch Result 02/24/2015 Compatible   Final  . Lactic Acid, Venous 02/24/2015 1.3  0.5 - 2.0 mmol/L Final  . Troponin I 02/24/2015 0.04* <0.031 ng/mL Final   Comment: READ BACK AND VERIFIED WITH GREG MOYER AT 1150 02/24/15 DAS        PERSISTENTLY INCREASED TROPONIN VALUES IN THE RANGE OF 0.04-0.49 ng/mL CAN BE SEEN IN:       -UNSTABLE ANGINA       -CONGESTIVE HEART FAILURE       -MYOCARDITIS       -CHEST TRAUMA       -ARRYHTHMIAS       -LATE PRESENTING MYOCARDIAL INFARCTION       -COPD   CLINICAL FOLLOW-UP RECOMMENDED.   Mar[place] Kitchen Specimen Description 02/24/2015 BLOOD LEFT HAND   Final  . Special Requests 02/24/2015 BOTTLES DRAWN AEROBIC AND ANAEROBIC  1 CC   Final  . Culture 02/24/2015 NO GROWTH < 24 HOURS   Final  . Report Status 02/24/2015 PENDING   Incomplete  . Specimen Description 02/24/2015 BLOOD RIGHT HAND   Final  . Special Requests 02/24/2015 BOTTLES DRAWN AEROBIC AND ANAEROBIC  1CC ANERO 2 CC AERO   Final  . Culture 02/24/2015 NO GROWTH < 24 HOURS   Final  . Report Status 02/24/2015 PENDING   Incomplete  . Prothrombin Time 02/24/2015 19.3* 11.4 - 15.0 seconds Final  . INR 02/24/2015 1.61   Final  . Lactic Acid, Venous 02/24/2015 1.3  0.5 - 2.0 mmol/L Final  . Order Confirmation 02/24/2015 ORDER PROCESSED BY BLOOD BANK   Final  . Unit Number 02/24/2015 [id number]   Final  . Blood Component Type 02/24/2015 PLTPH LI2 PAS   Final  . Unit division 02/24/2015 00   Final  . Status of Unit 02/24/2015 ISSUED,FINAL   Final  . Transfusion Status 02/24/2015 OK TO TRANSFUSE   Final  . MRSA by PCR 02/24/2015 NEGATIVE  NEGATIVE Final   Comment:        The GeneXpert MRSA Assay  (FDA approved for NASAL specimens only), is one component of a comprehensive MRSA colonization surveillance program. It is not intended to diagnose MRSA infection nor to guide or monitor treatment for MRSA infections.   . Potassium 02/24/2015 5.5* 3.5 - 5.1 mmol/L Final  . Glucose-Capillary 02/24/2015 218* 65 - 99 mg/dL Final  . WBC 02/25/2015 233.4* 3.6 - 11.0 K/uL Final   Comment: CRITICAL RESULT CALLED TO, READ BACK BY AND VERIFIED WITH: SARAH MOORE ON 02/25/15 AT 0708 BY QSD   . RBC 02/25/2015 1.92* 3.80 - 5.20 MIL/uL Final  .  Hemoglobin 02/25/2015 7.7* 12.0 - 16.0 g/dL Final  . HCT 02/25/2015 28.2* 35.0 - 47.0 % Final   SPUN HCT  . MCV 02/25/2015 90.7  80.0 - 100.0 fL Final  . MCH 02/25/2015 40.0* 26.0 - 34.0 pg Final  . MCHC 02/25/2015 27.3* 32.0 - 36.0 g/dL Final  . RDW 02/25/2015 23.3* 11.5 - 14.5 % Final  . Platelets 02/25/2015 19* 150 - 440 K/uL Final   Comment: CRITICAL RESULT CALLED TO, READ BACK BY AND VERIFIED WITH: [place] ON 02/25/15 AT 0708 BY QSD  PLATELET COUNT CONFIRMED BY SMEAR   . Sodium 02/25/2015 136  135 - 145 mmol/L Final  . Potassium 02/25/2015 3.6  3.5 - 5.1 mmol/L Final  . Chloride 02/25/2015 107  101 - 111 mmol/L Final  . CO2 02/25/2015 18* 22 - 32 mmol/L Final  . Glucose, Bld 02/25/2015 176* 65 - 99 mg/dL Final  . BUN 02/25/2015 50* 6 - 20 mg/dL Final  . Creatinine, Ser 02/25/2015 1.19* 0.44 - 1.00 mg/dL Final  . Calcium 02/25/2015 9.0  8.9 - 10.3 mg/dL Final  . Total Protein 02/25/2015 5.6* 6.5 - 8.1 g/dL Final  . Albumin 02/25/2015 2.3* 3.5 - 5.0 g/dL Final  . AST 02/25/2015 26  15 - 41 U/L Final  . ALT 02/25/2015 14  14 - 54 U/L Final  . Alkaline Phosphatase 02/25/2015 119  38 - 126 U/L Final  . Total Bilirubin 02/25/2015 1.6* 0.3 - 1.2 mg/dL Final  . GFR calc non Af Amer 02/25/2015 45* >60 mL/min Final  . GFR calc Af Amer 02/25/2015 53* >60 mL/min Final   Comment: (NOTE) The eGFR has been calculated using the CKD EPI equation. This  calculation has not been validated in all clinical situations. eGFR's persistently <60 mL/min signify possible Chronic Kidney Disease.   . Anion gap 02/25/2015 11  5 - 15 Final  . Glucose-Capillary 02/24/2015 116* 65 - 99 mg/dL Final  . Glucose-Capillary 02/24/2015 188* 65 - 99 mg/dL Final  . Glucose-Capillary 02/25/2015 157* 65 - 99 mg/dL Final  . Glucose-Capillary 02/25/2015 144* 65 - 99 mg/dL Final     STUDIES: Ct Head Wo Contrast  02/24/2015  CLINICAL DATA:  Increasing confusion and uncontrolled blood sugar coma possible head trauma with blood on patient's teeth EXAM: CT HEAD WITHOUT CONTRAST TECHNIQUE: Contiguous axial images were obtained from the base of the skull through the vertex without intravenous contrast. COMPARISON:  01/31/2015 FINDINGS: Mild to moderate diffuse age-related atrophy. No hydrocephalus. Low attenuation in the deep white matter, age-appropriate. No evidence of mass or vascular territory infarct. No hemorrhage or extra-axial fluid. The calvarium is intact. Mild inflammatory change in the left sphenoid sinus. IMPRESSION: Age-related involutional change with no acute abnormalities. Electronically Signed   By: Skipper Cliche M.D.   On: 02/24/2015 10:16   Ct Head Wo Contrast  01/31/2015  CLINICAL DATA:  Dizziness. EXAM: CT HEAD WITHOUT CONTRAST TECHNIQUE: Contiguous axial images were obtained from the base of the skull through the vertex without intravenous contrast. COMPARISON:  None. FINDINGS: Bony calvarium appears intact. No mass effect or midline shift is noted. Ventricular size is within normal limits. There is no evidence of mass lesion, hemorrhage or acute infarction. IMPRESSION: Normal head CT. Electronically Signed   By: Marijo Conception, M.D.   On: 01/31/2015 16:08   US Abdomen Complete  02/24/2015  CLINICAL DATA:  Right upper quadrant pain EXAM: ULTRASOUND ABDOMEN COMPLETE COMPARISON:  CT abdomen 11/27/2014 FINDINGS: Gallbladder: No cholelithiasis. Small  amount of gallbladder sludge. Gallbladder wall thickening measuring 5.8 mm. No pericholecystic fluid. Negative sonographic Murphy sign. Common bile duct: Diameter: 4.2 mm Liver: No focal lesion identified. Within normal limits in parenchymal echogenicity. IVC: No abnormality visualized. Pancreas: Visualized portion unremarkable. Spleen: Enlarged without focal abnormality. Splenic volume measures 1399.4 cc. Right Kidney: Length: 12.8 cm. Trace amount of perinephric fluid. Echogenicity within normal limits. No mass or hydronephrosis visualized. Left Kidney: Length: 12.9 mm. Echogenicity within normal limits. No mass or hydronephrosis visualized. Abdominal aorta: No aneurysm visualized. Other findings: Small right pleural effusion. Enlarged lymph node adjacent to the pancreatic head measuring 6 x 2.1 x 3.7 cm. IMPRESSION: 1. No cholelithiasis. Small amount dove gallbladder sludge and mild gallbladder wall thickening. Although this appearance can be seen with acalculous cholecystitis, this appearance can also be seen with fluid overload or hepatocellular disease. 2. Masses splenomegaly. 3. Enlarged peripancreatic lymph node. Electronically Signed   By: Kathreen Devoid   On: 02/24/2015 15:34   Dg Chest Port 1 View  02/24/2015  CLINICAL DATA:  Confusion. EXAM: PORTABLE CHEST 1 VIEW COMPARISON:  01/31/2015. FINDINGS: Cardiomegaly with pulmonary venous congestion, interstitial prominence, and right base infiltrate. Findings most consistent congestive heart failure. Right base pneumonia cannot be excluded. Low lung volumes. No pleural effusion or pneumothorax. No acute bony abnormality . IMPRESSION: 1. Cardiomegaly with mild pulmonary interstitial prominence suggesting congestive heart failure. 2. Right base mild infiltrate. This could represent asymmetric pulmonary edema and/or pneumonia. Low lung volumes. Electronically Signed   By: Marcello Moores  Register   On: 02/24/2015 10:06   Dg Abd Acute W/chest  01/31/2015  CLINICAL  DATA:  Acute left lower quadrant abdominal pain. EXAM: DG ABDOMEN ACUTE W/ 1V CHEST COMPARISON:  CT scan of November 27, 2014. FINDINGS: Stable cardiomediastinal silhouette. Mild bilateral pleural effusions are noted. No pneumoperitoneum is noted. Surgical clips are noted in the pelvis. Enlarged spleen is noted on the left. There is no evidence of bowel obstruction or ileus. IMPRESSION: Splenomegaly.  No evidence of bowel obstruction or ileus. Electronically Signed   By: Marijo Conception, M.D.   On: 01/31/2015 15:44    ASSESSMENT: Chronic myelogenous leukemia.  Thrombocytopenia and anemia secondary to chronic myelogenous leukemia.  Patient cannot swallow   gleevec  medication If we have to deliver effective dose of anti-leukemic treatment patient will have to have NG tube placed Patient would not require transfusion  I had prolonged discussion with patient's son, daughter-in-law, niece We discussed pros and cones of further aggressive therapy.  Patient has very poor quality of life had multiple admission in the hospital and rehabilitation facility has not made any significant progress.  Power of attorney is her son Herbie [place] and his wife in agreement that we should do comfort measures at present time. I discussed these findings with the attending physician  Dr. Mack Guise and we are all in agreement that patient would get a comfort measures will continue hydration and continue antibiotics.  Patient would be no code.  And will be transferred to regular floor where family can be with the patient I expect rapid declining condition. Total duration of visit was 50 minutes.  50% or more time was spent in counseling patient and family regarding prognosis and options of treatment and available resources  Patient expressed understanding and was in agreement with this plan. She also understands that She can call clinic at any time with any questions, concerns, or complaints.    No matching staging information was found  for the patient.  Forest Gleason, MD   02/25/2015 1:27 PM

## 2015-02-25 NOTE — Progress Notes (Signed)
RN spoke with patient's son Herbie [place] and nieces Maudie Mercury and Junious Dresser about oncologist's plan for a family meeting today to discuss treatment options and goals. Son Herbie [place] told RN he would be at the cancer center in about 1.5 to 2 hours. Niece Junious Dresser will try and be there as well, while niece Maudie Mercury stated she would not be able to present and at least Herbie Baltimore would be fine for the meeting. RN notified Dr. Oliva Bustard and [facility] receptionist about plan.

## 2015-02-26 DIAGNOSIS — I959 Hypotension, unspecified: Secondary | ICD-10-CM

## 2015-02-26 DIAGNOSIS — N179 Acute kidney failure, unspecified: Secondary | ICD-10-CM

## 2015-02-26 DIAGNOSIS — G9341 Metabolic encephalopathy: Secondary | ICD-10-CM

## 2015-02-26 DIAGNOSIS — Z515 Encounter for palliative care: Secondary | ICD-10-CM

## 2015-02-26 DIAGNOSIS — J189 Pneumonia, unspecified organism: Secondary | ICD-10-CM

## 2015-02-26 DIAGNOSIS — E875 Hyperkalemia: Secondary | ICD-10-CM

## 2015-02-26 DIAGNOSIS — A419 Sepsis, unspecified organism: Principal | ICD-10-CM

## 2015-02-26 DIAGNOSIS — C9210 Chronic myeloid leukemia, BCR/ABL-positive, not having achieved remission: Secondary | ICD-10-CM

## 2015-02-26 DIAGNOSIS — N289 Disorder of kidney and ureter, unspecified: Secondary | ICD-10-CM

## 2015-02-26 LAB — GLUCOSE, CAPILLARY: Glucose-Capillary: 153 mg/dL — ABNORMAL HIGH (ref 65–99)

## 2015-02-26 MED ORDER — MORPHINE SULFATE (PF) 2 MG/ML IV SOLN
2.0000 mg | INTRAVENOUS | Status: DC | PRN
  Administered 2015-02-26 (×2): 2 mg via INTRAVENOUS
  Filled 2015-02-26 (×2): qty 1

## 2015-02-26 MED ORDER — LORAZEPAM 1 MG PO TABS: 1.0000 mg | ORAL_TABLET | ORAL | Status: AC | PRN

## 2015-02-26 MED ORDER — CETYLPYRIDINIUM CHLORIDE 0.05 % MT LIQD
7.0000 mL | Freq: Two times a day (BID) | OROMUCOSAL | Status: DC
  Administered 2015-02-26: 14:00:00 7 mL via OROMUCOSAL

## 2015-02-26 MED ORDER — CHLORHEXIDINE GLUCONATE 0.12 % MT SOLN: 15.0000 mL | Freq: Two times a day (BID) | OROMUCOSAL | Status: DC

## 2015-02-26 MED ORDER — MORPHINE SULFATE (CONCENTRATE) 10 MG /0.5 ML PO SOLN: 5.0000 mg | ORAL | Status: AC | PRN

## 2015-02-26 MED ORDER — MORPHINE 100MG IN NS 100ML (1MG/ML) PREMIX INFUSION: 2.0000 mg/h | INTRAVENOUS | Status: AC

## 2015-02-26 MED ORDER — PROCHLORPERAZINE 25 MG RE SUPP: 25.0000 mg | Freq: Two times a day (BID) | RECTAL | Status: AC | PRN

## 2015-02-26 MED ORDER — SODIUM CHLORIDE 0.9 % IV SOLN
INTRAVENOUS | Status: DC
  Administered 2015-02-26: 14:00:00 via INTRAVENOUS

## 2015-02-26 MED ORDER — BISACODYL 10 MG RE SUPP: 10.0000 mg | Freq: Every day | RECTAL | Status: DC | PRN

## 2015-02-26 MED ORDER — BISACODYL 10 MG RE SUPP: 10.0000 mg | Freq: Every day | RECTAL | Status: AC | PRN

## 2015-02-26 MED ORDER — INSULIN ASPART 100 UNIT/ML ~~LOC~~ SOLN
2.0000 [IU] | Freq: Once | SUBCUTANEOUS | Status: AC
  Administered 2015-02-26: 2 [IU] via SUBCUTANEOUS
  Filled 2015-02-26: qty 2

## 2015-02-26 MED ORDER — GLYCOPYRROLATE 1 MG PO TABS: 1.0000 mg | ORAL_TABLET | Freq: Three times a day (TID) | ORAL | Status: AC | PRN

## 2015-02-26 MED ORDER — SODIUM CHLORIDE 0.9 % IV SOLN
Freq: Once | INTRAVENOUS | Status: AC
  Administered 2015-02-26: 05:00:00 via INTRAVENOUS

## 2015-02-26 MED ORDER — GLYCOPYRROLATE 0.2 MG/ML IJ SOLN
0.4000 mg | Freq: Three times a day (TID) | INTRAMUSCULAR | Status: DC | PRN
  Filled 2015-02-26: qty 2

## 2015-02-26 MED ORDER — ACETAMINOPHEN 650 MG RE SUPP: 650.0000 mg | Freq: Four times a day (QID) | RECTAL | Status: AC | PRN

## 2015-02-26 MED ORDER — LORAZEPAM 2 MG/ML IJ SOLN: 1.0000 mg | INTRAMUSCULAR | Status: AC | PRN

## 2015-02-26 MED ORDER — MORPHINE 100MG IN NS 100ML (1MG/ML) PREMIX INFUSION
2.0000 mg/h | INTRAVENOUS | Status: DC
  Administered 2015-02-26: 2 mg/h via INTRAVENOUS
  Filled 2015-02-26: qty 100

## 2015-02-26 MED ORDER — LORAZEPAM 2 MG/ML IJ SOLN: 1.0000 mg | INTRAMUSCULAR | Status: DC | PRN

## 2015-02-26 MED ORDER — POTASSIUM CHLORIDE IN NACL 20-0.9 MEQ/L-% IV SOLN
INTRAVENOUS | Status: DC
  Administered 2015-02-26: 11:00:00 via INTRAVENOUS
  Filled 2015-02-26 (×3): qty 1000

## 2015-02-26 NOTE — Discharge Summary (Signed)
[facility] [place] NAME: Mikayla Keller    MR#:  [id number]  DATE OF BIRTH:  Aug 23, 1945  DATE OF ADMISSION:  02/24/2015 ADMITTING PHYSICIAN: Fritzi Mandes, MD  DATE OF DISCHARGE: No discharge date for patient encounter.  PRIMARY CARE PHYSICIAN: SKARIAH,ANITA, DO     ADMISSION DIAGNOSIS:  Other pancytopenia ([place]) [D61.818]  DISCHARGE DIAGNOSIS:  Principal Problem:   Sepsis ([place]) Active Problems:   Pneumonia   Encephalopathy, metabolic   Hypotension   CML (chronic myeloid leukemia) (HCC)   CML (chronic myelocytic leukemia) (HCC)   Renal insufficiency   Hyperkalemia   SECONDARY DIAGNOSIS:   Past Medical History  Diagnosis Date  . 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 Hypertension   . Diabetes mellitus without complication ([place])   . Shortness of breath dyspnea   . History of chemotherapy 2005  . CML (chronic myelocytic leukemia) ([place])   . CML (chronic myelocytic leukemia) ([place])   . Leukemia ([place])   . Leukemia ([place])     .pro HOSPITAL COURSE:   Patient is 69 year old African-American female with history of chronic myelocytic leukemia and ovarian cancer who presents to the hospital with complaints of altered mental status. She was noted to be febrile with temperature 100.6 tachycardic, she has some blood on the gums and was noted to be hypotensive. Patient's lab studies revealed renal insufficiency hyperkalemia mild elevation of troponin and markedly elevated white blood cell count as well as anemia and severe thrombocytopenia. Radiologic studies showed right base infiltrate. Patient was initiated on broad-spectrum antibiotic therapy and admitted to intensive care unit. She however did not improve with conservative therapy. Palliative care was consulted and recommended hospice home placement Discussion by problem 1. Sepsis due to bacterial pneumonia of unclear etiology agent at present, patient does not  improve on broad-spectrum antibiotic coverage with vancomycin and Zosyn as well as IV fluids. Blood cultures are taken and so far are negative 2. Bacterial pneumonia, unclear etiologic agent, discontinue broad-spectrum antibiotic therapy supportive therapy only patient will be transferred to hospice home if clinically stable for palliative care recommendations,  3. Metabolic encephalopathy, supportive therapy only,. Patient's family had that discussion with Dr. Oliva Bustard and decided on comfort care.  palliative care physician had chance to discuss case with family today again and they decided on hospice home placement.  4. Renal insufficiency stable 5. HypotensionDiscontinueIV fluids  6. Tachycardia, likely due to sepsis, unable to use beta blockers or calcium channel blockers due to hypotension, supportive therapy 7. CML. No platelet, or red blood cell transfusion per oncology's recommendation, discontinue Gleevec 8. Diabetes mellitus, No interventions   plans discussed with palliative care  DISCHARGE CONDITIONS:   Poor   CONSULTS OBTAINED:  Treatment Team:  Forest Gleason, MD  DRUG ALLERGIES:  No Known Allergies  DISCHARGE MEDICATIONS:   Current Discharge Medication List    START taking these medications   Details  acetaminophen (TYLENOL) 650 MG suppository Place 1 suppository (650 mg total) rectally every 6 (six) hours as needed for mild pain (or Fever >/= 101). Qty: 12 suppository, Refills: 0    bisacodyl (DULCOLAX) 10 MG suppository Place 1 suppository (10 mg total) rectally daily as needed for moderate constipation. Qty: 12 suppository, Refills: 0    glycopyrrolate (ROBINUL) 1 MG tablet Take 1 tablet (1 mg total) by mouth 3 (three) times daily as needed. Qty: 12 tablet, Refills: 0    LORazepam (ATIVAN) 1 MG tablet Take 1  tablet (1 mg total) by mouth every 4 (four) hours as needed for anxiety. Qty: 12 tablet, Refills: 0    LORazepam (ATIVAN) 2 MG/ML injection Inject 0.5 mLs (1  mg total) into the vein every 4 (four) hours as needed for anxiety. Qty: 3 mL, Refills: 0    morphine 1 mg/mL SOLN infusion Inject 2 mg/hr into the vein continuous. Qty: 100 mL, Refills: 0    Morphine Sulfate (MORPHINE CONCENTRATE) 10 mg / 0.5 ml concentrated solution Take 0.25 mLs (5 mg total) by mouth every 2 (two) hours as needed (pain or shortness of breath --use if IV access is limited or unavailable --or to augment IV morphine drip effects if needed.). Qty: 30 mL, Refills: 0    prochlorperazine (COMPAZINE) 25 MG suppository Place 1 suppository (25 mg total) rectally every 12 (twelve) hours as needed for nausea or vomiting. Qty: 3 suppository, Refills: 0      STOP taking these medications     acetaminophen (TYLENOL) 325 MG tablet      allopurinol (ZYLOPRIM) 100 MG tablet      atenolol (TENORMIN) 50 MG tablet      glipiZIDE (GLUCOTROL) 10 MG tablet      lisinopril-hydrochlorothiazide (PRINZIDE,ZESTORETIC) 20-12.5 MG per tablet      metFORMIN (GLUCOPHAGE) 1000 MG tablet      Multiple Vitamins-Minerals (MULTIVITAMIN WITH MINERALS) tablet      pravastatin (PRAVACHOL) 40 MG tablet      predniSONE (DELTASONE) 5 MG tablet      promethazine (PHENERGAN) 25 MG tablet          DISCHARGE INSTRUCTIONS:    No planned Follow up   If you experience worsening of your admission symptoms, develop shortness of breath, life threatening emergency, suicidal or homicidal thoughts you must seek medical attention immediately by calling 911 or calling your MD immediately  if symptoms less severe.  You Must read complete instructions/literature along with all the possible adverse reactions/side effects for all the Medicines you take and that have been prescribed to you. Take any new Medicines after you have completely understood and accept all the possible adverse reactions/side effects.   Please note  You were cared for by a hospitalist during your hospital stay. If you have any questions  about your discharge medications or the care you received while you were in the hospital after you are discharged, you can call the unit and asked to speak with the hospitalist on call if the hospitalist that took care of you is not available. Once you are discharged, your primary care physician will handle any further medical issues. Please note that NO REFILLS for any discharge medications will be authorized once you are discharged, as it is imperative that you return to your primary care physician (or establish a relationship with a primary care physician if you do not have one) for your aftercare needs so that they can reassess your need for medications and monitor your lab values.    Today   CHIEF COMPLAINT:   Chief Complaint  Patient presents with  . Altered Mental Status    HISTORY OF PRESENT ILLNESS:  Erick Oxendine  is a 69 y.o. female with a known history of chronic myelocytic leukemia and ovarian cancer who presents to the hospital with complaints of altered mental status. She was noted to be febrile with temperature 100.6 tachycardic, she has some blood on the gums and was noted to be hypotensive. Patient's lab studies revealed renal insufficiency hyperkalemia mild elevation of  troponin and markedly elevated white blood cell count as well as anemia and severe thrombocytopenia. Radiologic studies showed right base infiltrate. Patient was initiated on broad-spectrum antibiotic therapy and admitted to intensive care unit. She however did not improve with conservative therapy. Palliative care was consulted and recommended hospice home placement Discussion by problem 1. Sepsis due to bacterial pneumonia of unclear etiology agent at present, patient does not improve on broad-spectrum antibiotic coverage with vancomycin and Zosyn as well as IV fluids. Blood cultures are taken and so far are negative 2. Bacterial pneumonia, unclear etiologic agent, discontinue broad-spectrum antibiotic therapy  supportive therapy only patient will be transferred to hospice home if clinically stable for palliative care recommendations,  3. Metabolic encephalopathy, supportive therapy only,. Patient's family had that discussion with Dr. Oliva Bustard and decided on comfort care.  palliative care physician had chance to discuss case with family today again and they decided on hospice home placement.  4. Renal insufficiency stable 5. HypotensionDiscontinueIV fluids  6. Tachycardia, likely due to sepsis, unable to use beta blockers or calcium channel blockers due to hypotension, supportive therapy 7. CML. No platelet, or red blood cell transfusion per oncology's recommendation, discontinue Gleevec 8. Diabetes mellitus, No interventions    VITAL SIGNS:  Blood pressure 95/55, pulse 110, temperature 98.6 F (37 C), temperature source Oral, resp. rate 18, height 5\' 5"  (1.651 m), weight 62.1 kg (136 lb 14.5 oz), SpO2 92 %.  I/O:  No intake or output data in the 24 hours ending 02/26/15 1421  PHYSICAL EXAMINATION:  GENERAL:  69 y.o.-year-old patient lying in the bed with no acute distress.  EYES: Pupils equal, round, reactive to light and accommodation. No scleral icterus. Extraocular muscles intact.  HEENT: Head atraumatic, normocephalic. Oropharynx and nasopharynx clear.  NECK:  Supple, no jugular venous distention. No thyroid enlargement, no tenderness.  LUNGS: Normal breath sounds bilaterally, no wheezing, rales,rhonchi or crepitation. No use of accessory muscles of respiration.  CARDIOVASCULAR: S1, S2 normal. No murmurs, rubs, or gallops.  ABDOMEN: Soft, non-tender, non-distended. Bowel sounds present. No organomegaly or mass.  EXTREMITIES: No pedal edema, cyanosis, or clubbing.  NEUROLOGIC: Cranial nerves II through XII are intact. Muscle strength 5/5 in all extremities. Sensation intact. Gait not checked.  PSYCHIATRIC: The patient is alert and oriented x 3.  SKIN: No obvious rash, lesion, or ulcer.    DATA REVIEW:   CBC  Recent Labs Lab 02/25/15 0541  WBC 233.4*  HGB 7.7*  HCT 28.2*  PLT 19*    Chemistries   Recent Labs Lab 02/25/15 0541  NA 136  K 3.6  CL 107  CO2 18*  GLUCOSE 176*  BUN 50*  CREATININE 1.19*  CALCIUM 9.0  AST 26  ALT 14  ALKPHOS 119  BILITOT 1.6*    Cardiac Enzymes  Recent Labs Lab 02/24/15 0907  TROPONINI 0.04*    Microbiology Results  Results for orders placed or performed during the hospital encounter of 02/24/15  Culture, blood (routine x 2)     Status: None (Preliminary result)   Collection Time: 02/24/15 10:26 AM  Result Value Ref Range Status   Specimen Description BLOOD LEFT HAND  Final   Special Requests BOTTLES DRAWN AEROBIC AND ANAEROBIC  1 CC  Final   Culture NO GROWTH 2 DAYS  Final   Report Status PENDING  Incomplete  Culture, blood (routine x 2)     Status: None (Preliminary result)   Collection Time: 02/24/15 10:34 AM  Result Value Ref Range Status  Specimen Description BLOOD RIGHT HAND  Final   Special Requests   Final    BOTTLES DRAWN AEROBIC AND ANAEROBIC  1CC ANERO 2 CC AERO   Culture NO GROWTH 2 DAYS  Final   Report Status PENDING  Incomplete  MRSA PCR Screening     Status: None   Collection Time: 02/24/15  1:10 PM  Result Value Ref Range Status   MRSA by PCR NEGATIVE NEGATIVE Final    Comment:        The GeneXpert MRSA Assay (FDA approved for NASAL specimens only), is one component of a comprehensive MRSA colonization surveillance program. It is not intended to diagnose MRSA infection nor to guide or monitor treatment for MRSA infections.     RADIOLOGY:  US Abdomen Complete  02/24/2015  CLINICAL DATA:  Right upper quadrant pain EXAM: ULTRASOUND ABDOMEN COMPLETE COMPARISON:  CT abdomen 11/27/2014 FINDINGS: Gallbladder: No cholelithiasis. Small amount of gallbladder sludge. Gallbladder wall thickening measuring 5.8 mm. No pericholecystic fluid. Negative sonographic Murphy sign. Common bile duct:  Diameter: 4.2 mm Liver: No focal lesion identified. Within normal limits in parenchymal echogenicity. IVC: No abnormality visualized. Pancreas: Visualized portion unremarkable. Spleen: Enlarged without focal abnormality. Splenic volume measures 1399.4 cc. Right Kidney: Length: 12.8 cm. Trace amount of perinephric fluid. Echogenicity within normal limits. No mass or hydronephrosis visualized. Left Kidney: Length: 12.9 mm. Echogenicity within normal limits. No mass or hydronephrosis visualized. Abdominal aorta: No aneurysm visualized. Other findings: Small right pleural effusion. Enlarged lymph node adjacent to the pancreatic head measuring 6 x 2.1 x 3.7 cm. IMPRESSION: 1. No cholelithiasis. Small amount dove gallbladder sludge and mild gallbladder wall thickening. Although this appearance can be seen with acalculous cholecystitis, this appearance can also be seen with fluid overload or hepatocellular disease. 2. Masses splenomegaly. 3. Enlarged peripancreatic lymph node. Electronically Signed   By: Kathreen Devoid   On: 02/24/2015 15:34    EKG:   Orders placed or performed during the hospital encounter of 02/24/15  . ED EKG  . ED EKG      Management plans discussed with the patient, family and they are in agreement.  CODE STATUS:     Code Status Orders        Start     Ordered   02/26/15 0457  Do not attempt resuscitation (DNR)   Continuous    Question Answer Comment  In the event of cardiac or respiratory ARREST Do not call a "code blue"   In the event of cardiac or respiratory ARREST Do not perform Intubation, CPR, defibrillation or ACLS   In the event of cardiac or respiratory ARREST Use medication by any route, position, wound care, and other measures to relive pain and suffering. May use oxygen, suction and manual treatment of airway obstruction as needed for comfort.   Comments phone verification with pt's son Herbie Baltimore  and 2 RN's Ignatius [employer] and Mariane Baumgarten      02/26/15 0500       TOTAL TIME TAKING CARE OF THIS PATIENT: 40 minutes.    Theodoro Grist M.D on 02/26/2015 at 2:21 PM  Between 7am to 6pm - Pager - [phone]  After 6pm go to [email or web address] - password [email or web address]  Office  [phone]  CC: Primary care physician; Elton Sin, DO

## 2015-02-26 NOTE — Progress Notes (Signed)
Called Darryl Nestle, son of pt., and shared with him changes in his mothers present condition. Talked with him about records that indicate that comfort care was discussed with him however code status on his mother's record had not been changed. I asked for clarification about what he wanted done in the event that his mother stopped breathing and/or she lost her pulse. He stated, "We don't want anything else done."   Asked him to repeat his requests to another nurse. Phone handed to Mariane Baumgarten, Therapist, sports.

## 2015-02-26 NOTE — Plan of Care (Signed)
Problem: Discharge Progression Outcomes Goal: Other Discharge Outcomes/Goals Outcome: Progressing Pt from [place]. Increased HR and respirations with pain, need for IV push pain meds every 2-3 hours NPO with IV fluids Currently Full Code with possible comfort care in AM Pending Palliative Care consult with son.

## 2015-02-26 NOTE — Clinical Documentation Improvement (Signed)
Internal Medicine  Please clarify if the presenting signs and symptoms of "Patient admitted with facial numbness and progressive expressive aphasia" are:   A sequela of patient's recent CVA  Unrelated to previous CVA  Other  Clinically Undetermined  Please exercise your independent, professional judgment when responding. A specific answer is not anticipated or expected.  Thank You, Zoila Shutter RN, [place] [phone]

## 2015-02-26 NOTE — Progress Notes (Signed)
Palliative Care Update  I called and talked with pts son who is Media planner.   Son has agreed to ask Hospice Liaison to evaluate pt and then call him back.   Son agreed to total comfort care so ABX and IVF are being DCd except I am ordering a morphine drip as pt has significant pain and becomes quite distressed when the morphine IV wears off (per nursing).    I have spoken with care mgmt and with Hospice Liaison and with Dr. Norlene Duel.  I have done the med recs for discharge to [facility] --and have signed the Rxs --so this is ready to go when/ if pt is DCd to [facility].  I think she would benefit from [facility] services.    She isn't going to live very long and needs symptom management.     Colleen Can, MD

## 2015-02-26 NOTE — Progress Notes (Signed)
New hospice home referral received from [place] following a Palliative Care consult with Dr. Megan Salon. Mikayla Keller was admitted to [facility] from [facility] on 10/17 for treatment of altered mental status, she was hypotensive, tachycardic and had an elevated temperature of 100.6. She was found to have right lower lobe pneumonia and was admitted for treatment. PMH includes ovarian cancer, HTN, DM II, Leukemia and CML. Patient had gone to [facility] care for rehab and was receiving [facility] for treatment of CML.  She has despite medical interventions continued to decline. Patient's son spoke with Dr. Megan Salon and has made the choice to pursue comfort and symptom management at the hospice home.  Patient has required 8 mg of IV morphine since 12:30am. Dr. Megan Salon has initiated a morphine drip at 2 mg / hr for treatment of pain and increased work of breathing.  Patient seen lying in bed, did not respond to voice, did respond negatively to mouth care, her respiratory rate increased and her eyes opened wide. Skin warm to touch. Per discussion with staff RN Tracie patient groans with turns and vital sign assessment. She is incontinent of  Bladder, last BM prior to admission. Writer spoke with patient's son [name] via phone to initiate education regarding hospice home services. Understanding voiced. He will not be returning to the hospital. Plan is for patient's other son [name] to sign consents at the hospice home after work this afternoon. Report called to hospice home, information sent to referral intake. Writer to notify EMS for transport. CSW Baxter Flattery, [facility] Hassan Rowan, staff RN Leana Roe and Dr. Megan Salon all aware of plan for patient to discharge to the Hospice Home this afternoon via EMS with portable DNR in place. Thank you for the opportunity to serve [name] and her family. Flo Shanks RN, BSN, [place] [facility] [place], [facility] [phone] c

## 2015-02-26 NOTE — Care Management Important Message (Signed)
Important Message  Patient Details  Name: Infantof Villagomez MRN: [id number] Date of Birth: 08-13-1945   Medicare Important Message Given:  Yes-second notification given    Juliann Pulse A Allmond 02/26/2015, 10:36 AM

## 2015-02-26 NOTE — Consult Note (Signed)
Palliative Medicine Inpatient Consult Note   Name: Mikayla Keller Date: 02/26/2015 MRN: [id number]  DOB: 04/13/1946  Referring Physician: Theodoro Grist, MD  Palliative Care consult requested for this 69 y.o. female for  Symptom management in patient with CML --now on somewhat of a de-escalated care plan (though she is still getting antibiotics and insulin and Gleevec for the [facility]).   Patient was made DNR as well.  Family met with Dr. Oliva Bustard at [facility] on Oct 18, about patient's grim prognosis.   TODAY'S DISCUSSIONS AND DECISIONS:  I have talked with son by phone.  He and other family members had just left.  There is reportedly just one son involved and two nieces.  Pt is DNR.  Pt still has aggressive meds ordered. I got permission to make pt complete comfort care and also to consult Hospice re [facility].  Will talk with Hospice Liaison and Care Mgmt/ Soc Work about this. Pt has regular but very heavy and somewhat labored respirations. She opens eyelids.  But her blood pressure is 109/48 and she is not showing signs of dying within hours.  Will discuss this further with others and notify attending.    IMPRESSION: 1.  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  Metabolic encephalopathy 3.  Right lower lobe infiltrate 4.  Sepsis due to bacterial pnuemonia of undetermined bacterial etiology 5.  Acute on chronic renal failure 6.  DM2 7.  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  Hyperkalemia 9.  Bleeding gums (not the cause of anemia --the leukemia is the cause of anemia) 10.  Essential HTN 11. Weakness 12. Anorexia of malignancy 13. Falls    REVIEW OF SYSTEMS:  Fever to 100.6 degrees. Fast heart rate.  Bleeding from gums. Hypotnesive.     SOCIAL HISTORY:  reports that she quit smoking about 56 years ago. She has never used smokeless  tobacco. She reports that she does not drink alcohol or use illicit drugs.  Son is Herbie [place]. Nieces are Maudie Mercury and  Junious Dresser.    LEGAL DOCUMENTS:  none   CODE STATUS: DNR  PAST MEDICAL HISTORY: Past Medical History  Diagnosis Date  . Ovarian cancer ([place]) 2005    endometrial cacner  . Hypertension   . Diabetes mellitus without complication ([place])   . Shortness of breath dyspnea   . History of chemotherapy 2005  . CML (chronic myelocytic leukemia) ([place])   . CML (chronic myelocytic leukemia) ([place])   . Leukemia ([place])   . Leukemia ([place])     PAST SURGICAL HISTORY:  Past Surgical History  Procedure Laterality Date  . Abdominal hysterectomy  2005    endometrial cancer  . Ovary surgery Bilateral   . Colonoscopy  2012 ?    ALLERGIES:  has No Known Allergies.  MEDICATIONS:  Current Facility-Administered Medications  Medication Dose Route Frequency Provider Last Rate Last Dose  . 0.9 % NaCl with KCl 20 mEq/ L  infusion   Intravenous Continuous Theodoro Grist, MD 125 mL/hr at 02/26/15 1040    . acetaminophen (TYLENOL) tablet 650 mg  650 mg Oral Q6H PRN Fritzi Mandes, MD       Or  . acetaminophen (TYLENOL) suppository 650 mg  650 mg Rectal Q6H PRN Fritzi Mandes, MD      . acetaminophen (TYLENOL) tablet 650 mg  650 mg Oral Q4H PRN Fritzi Mandes, MD      . antiseptic oral  rinse (CPC / CETYLPYRIDINIUM CHLORIDE 0.05%) solution 7 mL  7 mL Mouth Rinse q12n4p Theodoro Grist, MD      . chlorhexidine (PERIDEX) 0.12 % solution 15 mL  15 mL Mouth Rinse BID Theodoro Grist, MD      . HYDROcodone-acetaminophen (NORCO/VICODIN) 5-325 MG per tablet 1-2 tablet  1-2 tablet Oral Q4H PRN Fritzi Mandes, MD      . imatinib (GLEEVEC) tablet 400 mg  400 mg Oral Q breakfast Forest Gleason, MD   400 mg at 02/25/15 1545  . morphine 2 MG/ML injection 2 mg  2 mg Intravenous Q1H PRN Theodoro Grist, MD   2 mg at 02/26/15 1120  . ondansetron (ZOFRAN) tablet 4 mg  4 mg Oral Q6H PRN Fritzi Mandes, MD       Or  . ondansetron (ZOFRAN) injection 4  mg  4 mg Intravenous Q6H PRN Fritzi Mandes, MD      . piperacillin-tazobactam (ZOSYN) IVPB 3.375 g  3.375 g Intravenous 3 times per day Fritzi Mandes, MD   3.375 g at 02/26/15 0555  . vancomycin (VANCOCIN) 500 mg in sodium chloride 0.9 % 100 mL IVPB  500 mg Intravenous Q12H Fritzi Mandes, MD   500 mg at 02/26/15 0143    Vital Signs: BP 98/57 mmHg  Pulse 119  Temp(Src) 98.6 F (37 C) (Oral)  Resp 28  Ht 5' 5"  (1.651 m)  Wt 62.1 kg (136 lb 14.5 oz)  BMI 22.78 kg/m2  SpO2 94% Filed Weights   02/24/15 0900 02/24/15 1300  Weight: 62.596 kg (138 lb) 62.1 kg (136 lb 14.5 oz)    Estimated body mass index is 22.78 kg/(m^2) as calculated from the following:   Height as of this encounter: 5' 5"  (1.651 m).   Weight as of this encounter: 62.1 kg (136 lb 14.5 oz).  PERFORMANCE STATUS (ECOG) : 4 - Bedbound  PHYSICAL EXAM: On nasal cannula oxygen Loud regular deep breathing Opens eyes to name Does not folliow commands --was just given morphine Hrt rrr no m Lungs with some ronchi Abd soft with scant BS Skin --no mottling noted on feet --extremities are mildly warm.   LABS: CBC:    Component Value Date/Time   WBC 233.4* 02/25/2015 0541   HGB 7.7* 02/25/2015 0541   HCT 28.2* 02/25/2015 0541   PLT 19* 02/25/2015 0541   MCV 90.7 02/25/2015 0541   NEUTROABS 34.8* 02/13/2015 2005   LYMPHSABS 10.3* 02/13/2015 2005   MONOABS 4.0* 02/13/2015 2005   EOSABS 20.6* 02/13/2015 2005   BASOSABS 7.9* 02/13/2015 2005   Comprehensive Metabolic Panel:    Component Value Date/Time   NA 136 02/25/2015 0541   K 3.6 02/25/2015 0541   CL 107 02/25/2015 0541   CO2 18* 02/25/2015 0541   BUN 50* 02/25/2015 0541   CREATININE 1.19* 02/25/2015 0541   GLUCOSE 176* 02/25/2015 0541   CALCIUM 9.0 02/25/2015 0541   AST 26 02/25/2015 0541   ALT 14 02/25/2015 0541   ALKPHOS 119 02/25/2015 0541   BILITOT 1.6* 02/25/2015 0541   PROT 5.6* 02/25/2015 0541   ALBUMIN 2.3* 02/25/2015 0541    More than 50% of the visit  was spent in counseling/coordination of care: Yes  Time Spent:  48mnutes

## 2015-02-26 NOTE — Progress Notes (Signed)
Pt noted with moaning and tachypnea at intervals, morphine given for comfort. Family into see, booklet "gone from my sight" given to family, support given

## 2015-02-27 ENCOUNTER — Inpatient Hospital Stay: Payer: Medicare Other | Admitting: Oncology

## 2015-02-27 ENCOUNTER — Inpatient Hospital Stay: Payer: Medicare Other

## 2015-02-27 LAB — TYPE AND SCREEN
ABO/RH(D): O POS
ANTIBODY SCREEN: NEGATIVE
UNIT DIVISION: 0
Unit division: 0
Unit division: 0
Unit division: 0

## 2015-03-01 LAB — CULTURE, BLOOD (ROUTINE X 2)
Culture: NO GROWTH
Culture: NO GROWTH

## 2015-03-11 DEATH — deceased

## 2015-03-12 ENCOUNTER — Ambulatory Visit: Payer: Medicare Other | Admitting: Oncology

## 2015-03-12 ENCOUNTER — Other Ambulatory Visit: Payer: Medicare Other

## 2016-01-04 IMAGING — CR DG ABDOMEN ACUTE W/ 1V CHEST
1 series · 3 of 3 positions shown · non-contrast
Comparison: CT scan November 27, 2014.

CLINICAL DATA: Acute left lower quadrant abdominal pain.

EXAM:
DG ABDOMEN ACUTE W/ 1V CHEST

[Series 1: dg abd acute w/chest · 0.14mm/px · 3 of 3 slices shown]
[im 1/3]
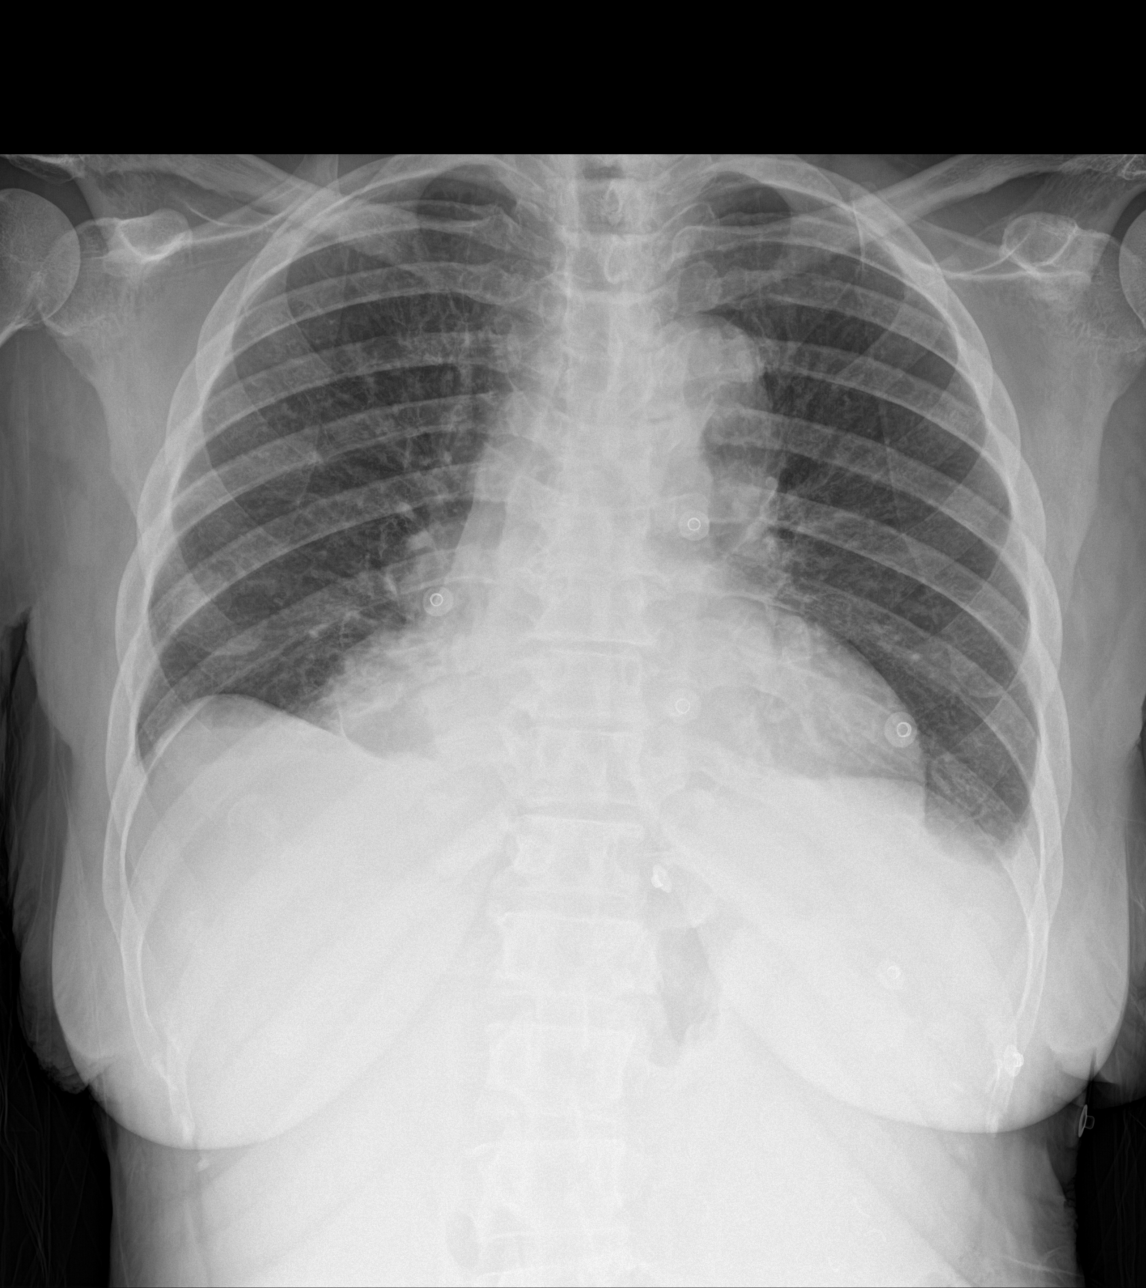
[im 2/3]
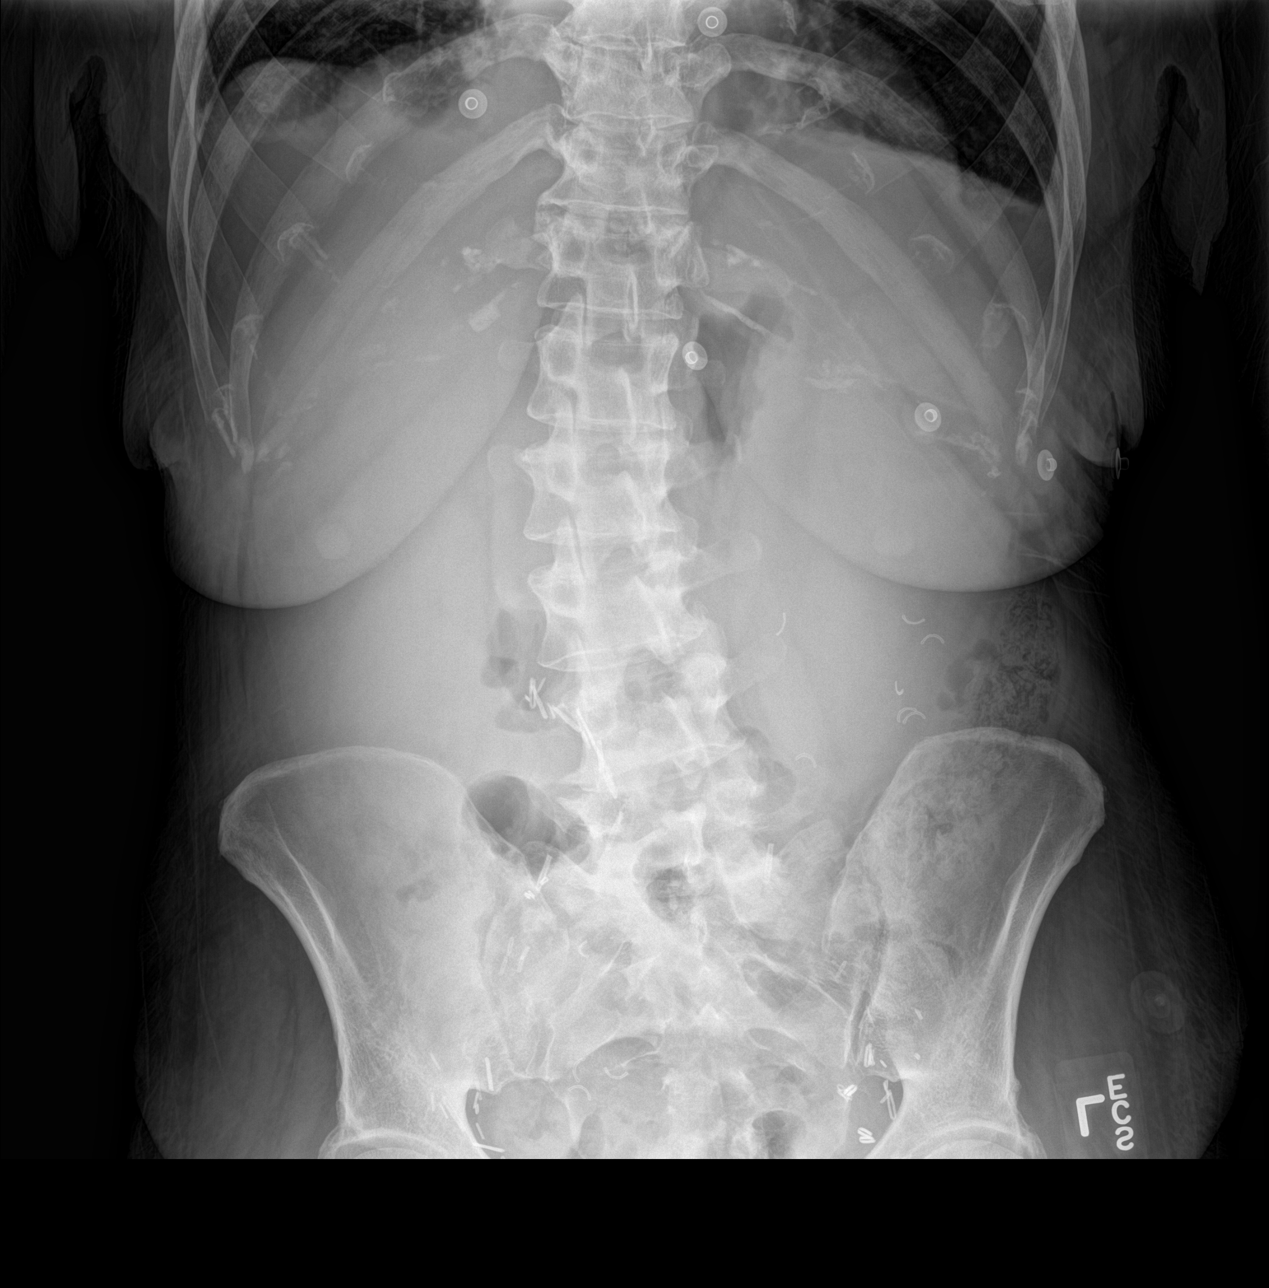
[im 3/3]
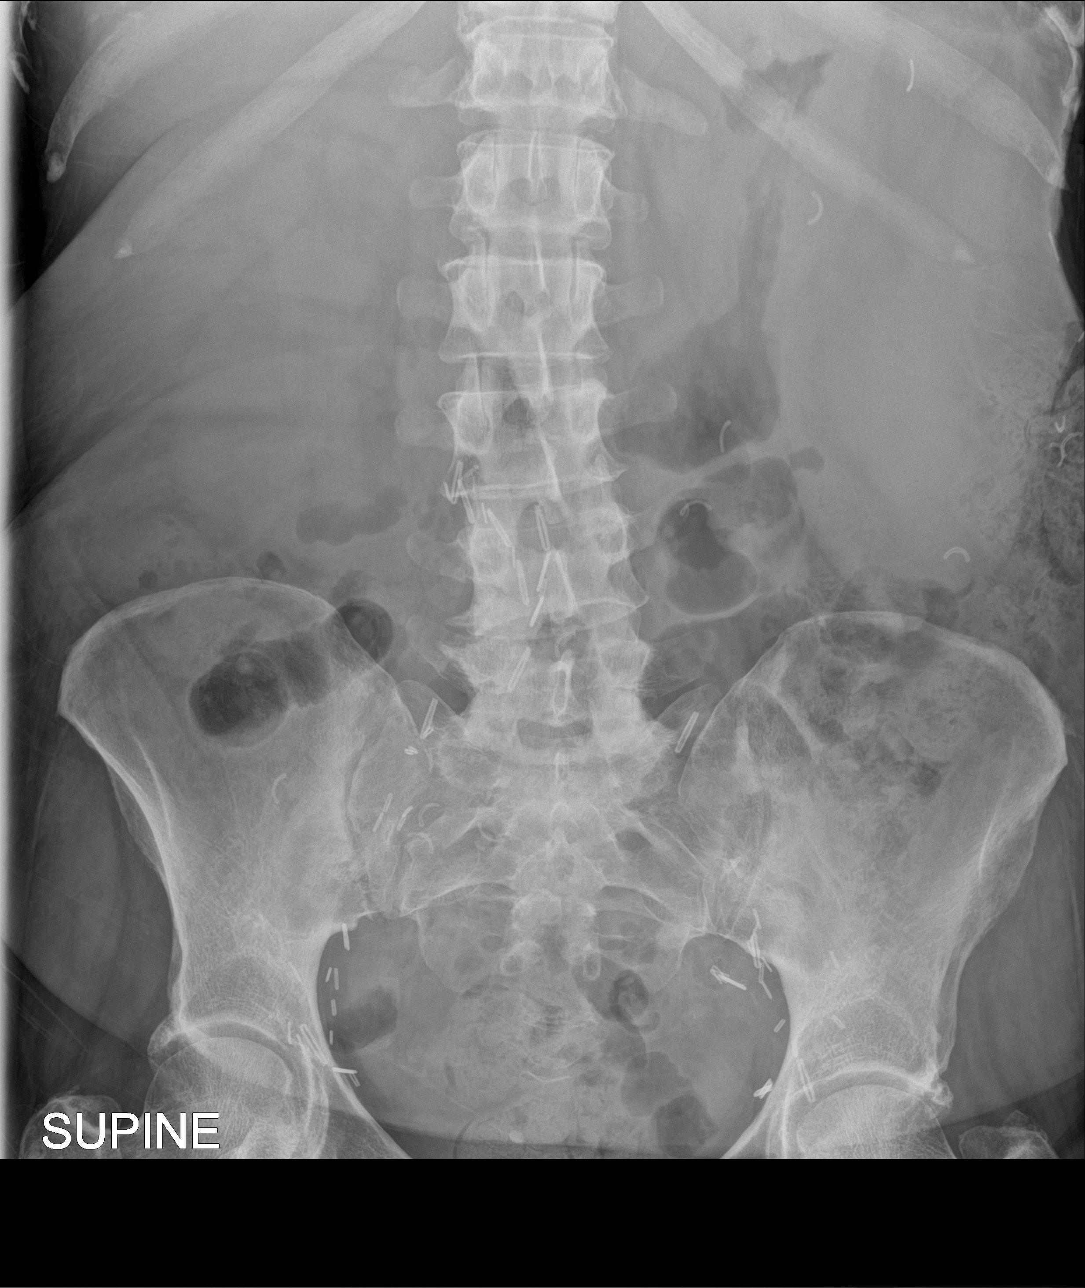

[3 of 3 positions shown; findings below may reference images not displayed]

FINDINGS: Stable cardiomediastinal silhouette. Mild bilateral pleural
effusions are noted. No pneumoperitoneum is noted. Surgical clips
are noted in the pelvis. Enlarged spleen is noted on the left. There
is no evidence of bowel obstruction or ileus.
IMPRESSION: Splenomegaly.  No evidence of bowel obstruction or ileus.

## 2016-01-04 IMAGING — CT CT HEAD W/O CM
4 of 6 series · 18 of 30 positions shown, 19 images · non-contrast
Comparison: None.

CLINICAL DATA: Dizziness.

EXAM:
CT HEAD WITHOUT CONTRAST
TECHNIQUE: Contiguous axial images were obtained from the base of the skull
through the vertex without intravenous contrast.

[Series 3: head bone · axial · 0.43mm/px · z∈[-117,-15]mm · 6 of 96 slices shown]
[im 14/96  bone]
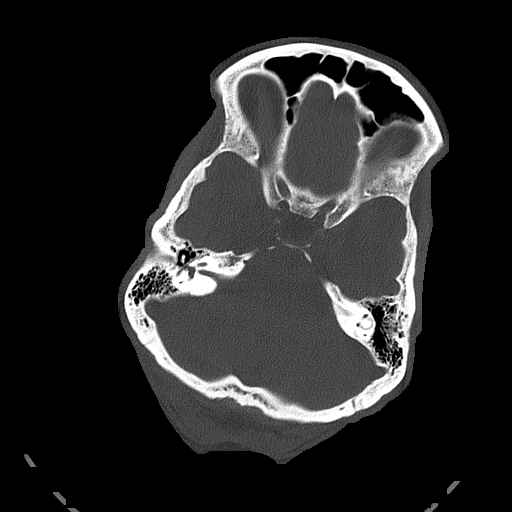
[im 28/96  bone]
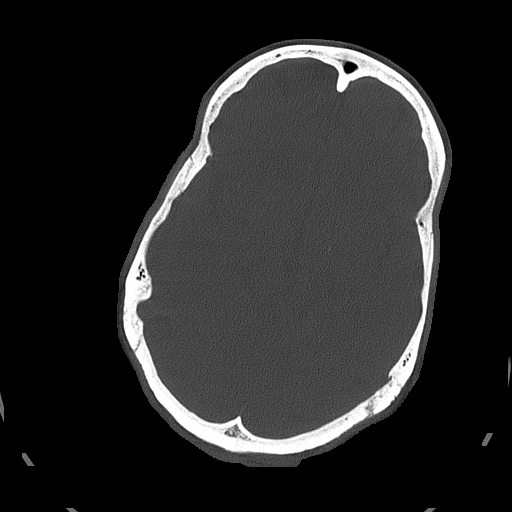
[im 41/96  bone]
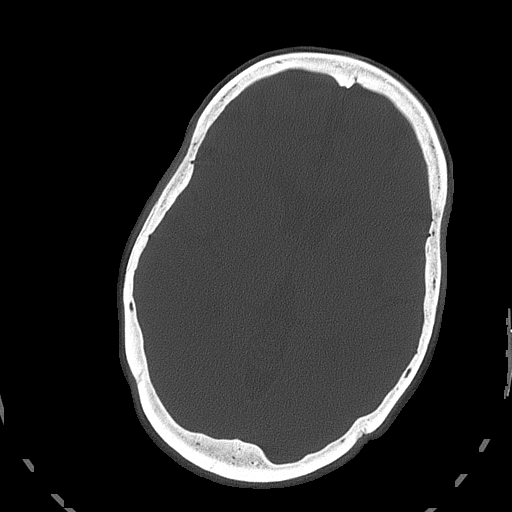
[im 55/96  bone]
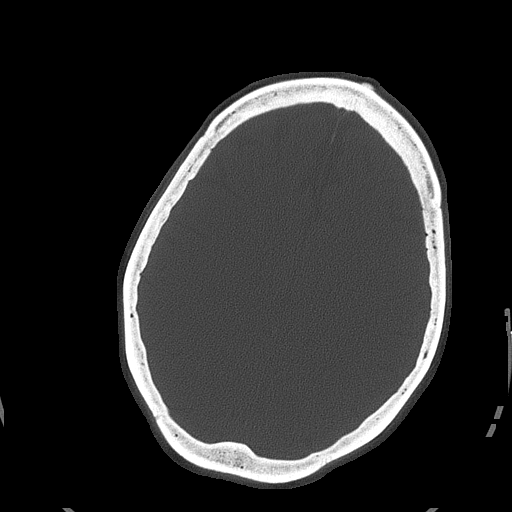
[im 68/96  bone]
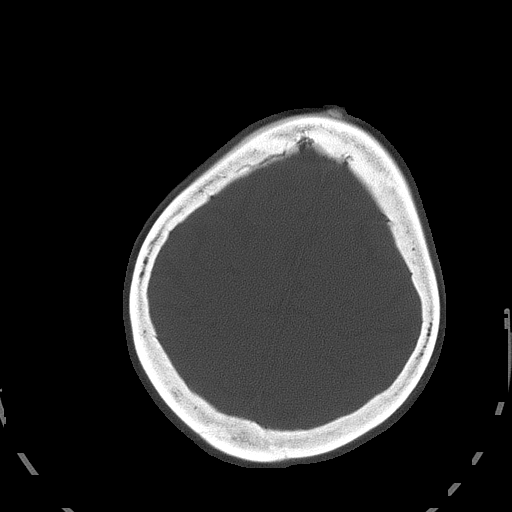
[im 82/96  bone]
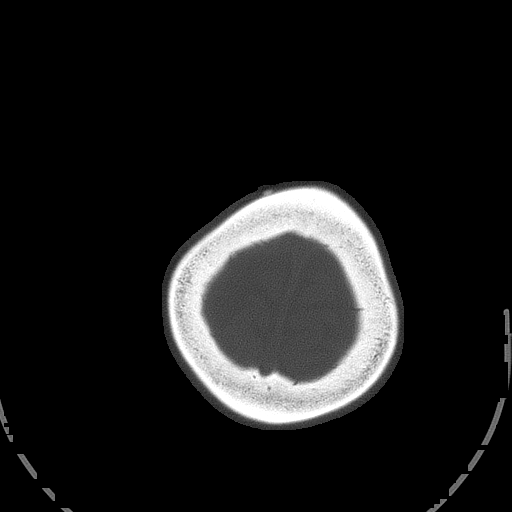

[Series 4: soft tissue · axial · 0.42mm/px · z∈[-162,-12]mm · 3 of 31 slices shown, 4 images]
[im 1/31  brain]
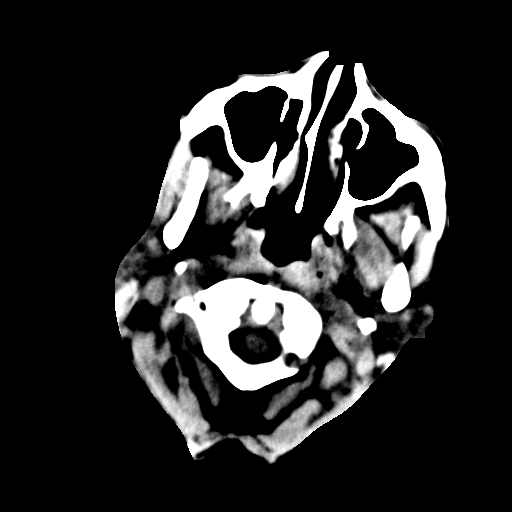
[im 1/31  bone]
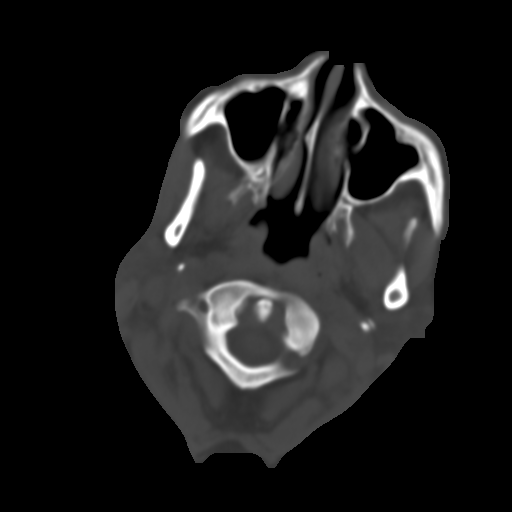
[im 16/31  brain]
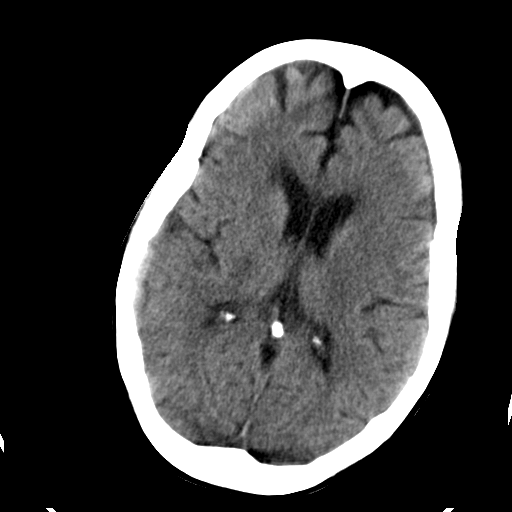
[im 31/31  brain]
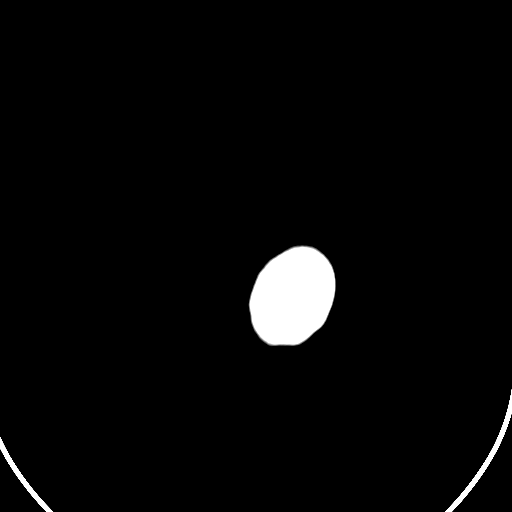

[Series 5: bone · axial · 0.42mm/px · z∈[-145,-31]mm · 5 of 87 slices shown]
[im 15/87  bone]
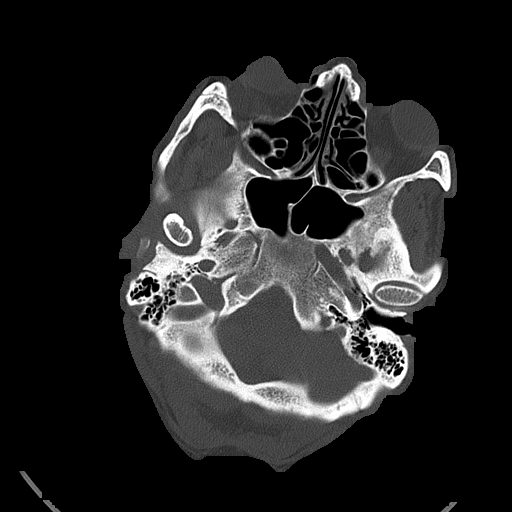
[im 29/87  bone]
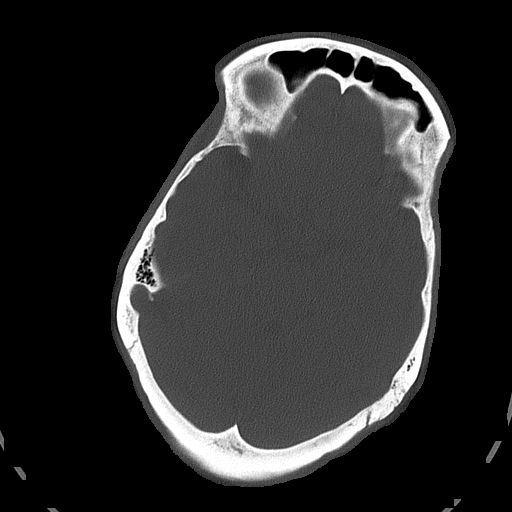
[im 44/87  bone]
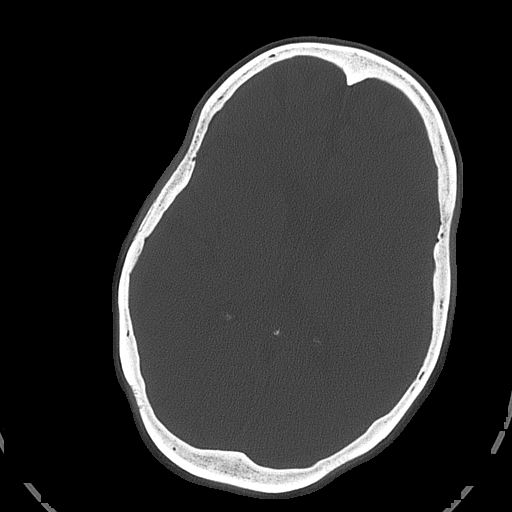
[im 58/87  bone]
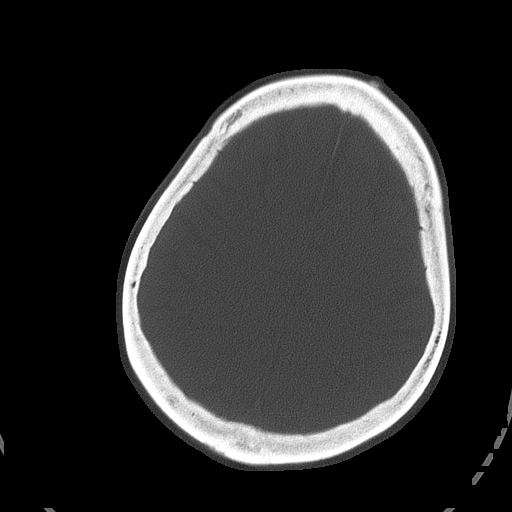
[im 72/87  bone]
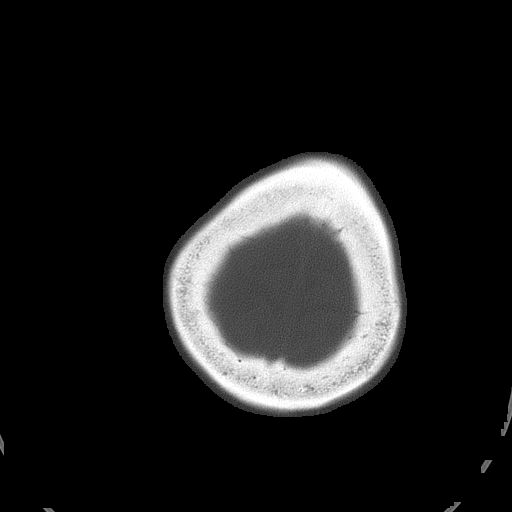

[Series 7: bone recon · axial · 0.42mm/px · z∈[-94,-7]mm · 4 of 76 slices shown]
[im 16/76  bone]
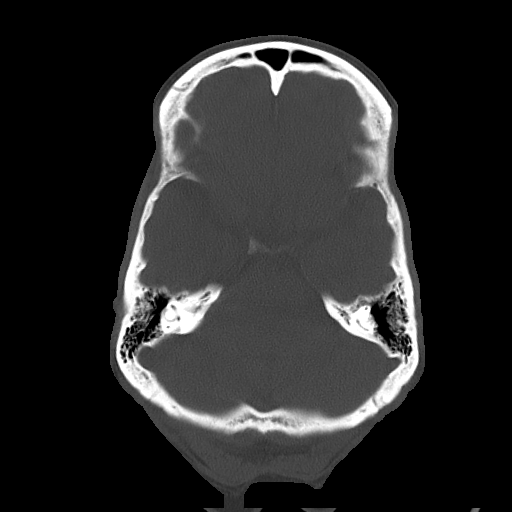
[im 31/76  bone]
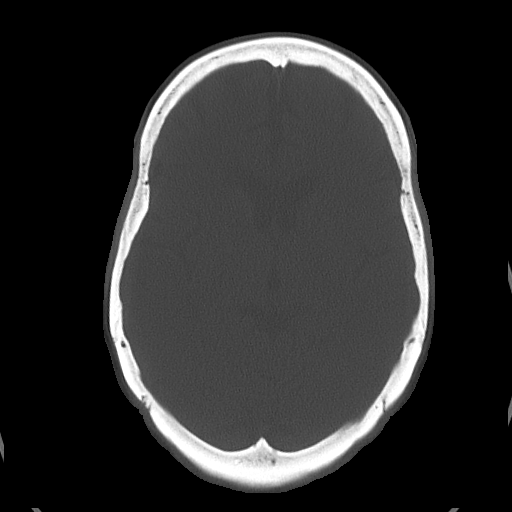
[im 46/76  bone]
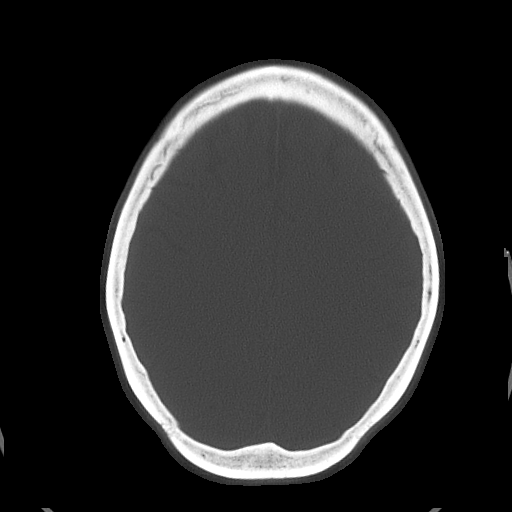
[im 61/76  bone]
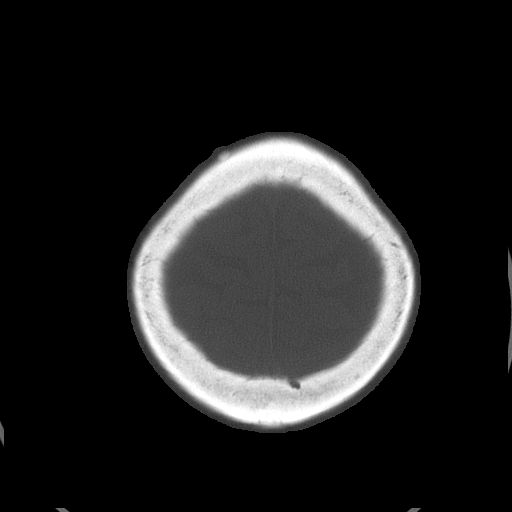

[18 of 30 positions shown; findings below may reference images not displayed]

FINDINGS: Bony calvarium appears intact. No mass effect or midline shift is
noted. Ventricular size is within normal limits. There is no
evidence of mass lesion, hemorrhage or acute infarction.
IMPRESSION: Normal head CT.
